# Patient Record
Sex: Female | Born: 1995 | Race: Black or African American | Hispanic: No | Smoking: Never smoker
Health system: Southern US, Community
[De-identification: ages and names within clinical notes are randomized; demographics above are authoritative.]

## PROBLEM LIST (undated history)

## (undated) ENCOUNTER — Inpatient Hospital Stay (HOSPITAL_COMMUNITY): Payer: Self-pay

## (undated) DIAGNOSIS — D649 Anemia, unspecified: Secondary | ICD-10-CM

## (undated) DIAGNOSIS — Z789 Other specified health status: Secondary | ICD-10-CM

## (undated) HISTORY — DX: Anemia, unspecified: D64.9

## (undated) HISTORY — PX: TONSILLECTOMY: SUR1361

---
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
## 2015-02-21 ENCOUNTER — Emergency Department (HOSPITAL_COMMUNITY)
Admission: EM | Admit: 2015-02-21 | Discharge: 2015-02-21 | Disposition: A | Discharge status: Home / Self Care | Payer: No Typology Code available for payment source | Attending: Emergency Medicine | Admitting: Emergency Medicine

## 2015-02-21 ENCOUNTER — Encounter (HOSPITAL_COMMUNITY): Payer: Self-pay | Admitting: *Deleted

## 2015-02-21 DIAGNOSIS — K088 Other specified disorders of teeth and supporting structures: Secondary | ICD-10-CM | POA: Diagnosis not present

## 2015-02-21 DIAGNOSIS — K029 Dental caries, unspecified: Secondary | ICD-10-CM | POA: Insufficient documentation

## 2015-02-21 DIAGNOSIS — Z79899 Other long term (current) drug therapy: Secondary | ICD-10-CM | POA: Insufficient documentation

## 2015-02-21 DIAGNOSIS — H9203 Otalgia, bilateral: Secondary | ICD-10-CM | POA: Diagnosis not present

## 2015-02-21 DIAGNOSIS — K0889 Other specified disorders of teeth and supporting structures: Secondary | ICD-10-CM

## 2015-02-21 MED ORDER — PENICILLIN V POTASSIUM 500 MG PO TABS: 500.0000 mg | ORAL_TABLET | Freq: Four times a day (QID) | ORAL | Status: AC

## 2015-02-21 MED ORDER — KETOROLAC TROMETHAMINE 60 MG/2ML IM SOLN
60.0000 mg | Freq: Once | INTRAMUSCULAR | Status: AC
  Administered 2015-02-21: 60 mg via INTRAMUSCULAR
  Filled 2015-02-21: qty 2

## 2015-02-21 MED ORDER — IBUPROFEN 600 MG PO TABS: 600.0000 mg | ORAL_TABLET | Freq: Four times a day (QID) | ORAL | Status: DC | PRN

## 2015-02-21 NOTE — ED Provider Notes (Signed)
CSN: [id number]     Arrival date & time 02/21/15  0727 History   First MD Initiated Contact with Patient 02/21/15 [phone]     Chief Complaint  Patient presents with  . Dental Pain  . Otalgia     (Consider location/radiation/quality/duration/timing/severity/associated sxs/prior Treatment) HPI  Donna Singh is a 19 y.o. female presenting with left lower dental pain that started days ago it is described as throbbing and worse with lying down. Pain radiates to bilateral ears. Patient hasn't taken anything for her symptoms. She denies alleviating factors. No fevers or chills. No drooling, chest pain, shortness of breath. No history of diabetes. Patient  Has not seen a dentist.  History reviewed. No pertinent past medical history. History reviewed. No pertinent past surgical history. History reviewed. No pertinent family history. History  Substance Use Topics  . Smoking status: Never Smoker   . Smokeless tobacco: Not on file  . Alcohol Use: No   OB History    No data available     Review of Systems  Constitutional: Negative for fever and chills.  HENT: Positive for dental problem. Negative for drooling.   Gastrointestinal: Negative for nausea and vomiting.      Allergies  Review of patient's allergies indicates no known allergies.  Home Medications   Prior to Admission medications   Medication Sig Start Date End Date Taking? Authorizing Provider  HYDROcodone-acetaminophen (NORCO/VICODIN) 5-325 MG per tablet Take 1-2 tablets by mouth every 6 (six) hours as needed. 12/01/14   Teressa LowerVrinda Pickering, NP  ibuprofen (ADVIL,MOTRIN) 600 MG tablet Take 1 tablet (600 mg total) by mouth every 6 (six) hours as needed. 02/21/15   Oswaldo ConroyVictoria Twan Harkin, PA-C  penicillin v potassium (VEETID) 500 MG tablet Take 1 tablet (500 mg total) by mouth 4 (four) times daily. 02/21/15 02/28/15  Oswaldo ConroyVictoria Aimee Heldman, PA-C   BP 121/78 mmHg  Pulse 76  Temp(Src) 99.1 F (37.3 C) (Oral)  Resp 18  SpO2 100%  LMP  01/24/2015 Physical Exam  Constitutional: She appears well-developed and well-nourished. No distress.  HENT:  Head: Normocephalic and atraumatic.  Mouth/Throat: Oropharynx is clear and moist. No oropharyngeal exudate.  No trismus or uvula deviation No lip, tongue, facial swelling. No swelling under tongue or tenderness. Patient handling secretions. No drooling. Patient with poor dentition. Patient with tenderness to left lower tooth #[id number] with significant decay with mild erythema in this gingiva but no drainable abscess.   Eyes: Conjunctivae are normal. Right eye exhibits no discharge. Left eye exhibits no discharge.  Neck: Normal range of motion. Neck supple.  No neck masses or tenderness.  Cardiovascular: Normal rate and regular rhythm.   Pulmonary/Chest: Effort normal and breath sounds normal. No respiratory distress. She has no wheezes.  Abdominal: Soft. She exhibits no distension. There is no tenderness.  Lymphadenopathy:    She has no cervical adenopathy.  Neurological: She is alert. Coordination normal.  Skin: Skin is warm and dry. She is not diaphoretic.  Nursing note and vitals reviewed.   ED Course  Procedures (including critical care time) Labs Review Labs Reviewed - No data to display  Imaging Review No results found.   EKG Interpretation None      Meds given in ED:  Medications  ketorolac (TORADOL) injection 60 mg (60 mg Intramuscular Given 02/21/15 0807)    New Prescriptions   IBUPROFEN (ADVIL,MOTRIN) 600 MG TABLET    Take 1 tablet (600 mg total) by mouth every 6 (six) hours as needed.   PENICILLIN V POTASSIUM (VEETID) 500  MG TABLET    Take 1 tablet (500 mg total) by mouth 4 (four) times daily.      MDM   Final diagnoses:  Pain, dental   Patient with toothache.  No gross abscess.  No history of diabetes. Exam unconcerning for Ludwig's angina or spread of infection.  Will treat with penicillin and NSAIDs. Urged patient to follow-up with dentist.  ED  resources provided. Referral provided.  Discussed return precautions with patient. Patient and father verbalizes understanding and agrees with plan.    Oswaldo Conroy, PA-C 02/21/15 1610  Samuel Jester, DO 02/22/15 [phone]

## 2015-02-21 NOTE — ED Notes (Signed)
Declined W/C at D/C and was escorted to lobby by RN. 

## 2015-02-21 NOTE — ED Notes (Signed)
Pt reports Dental pain started on Monday and now Pt as Bil. Ear pain.

## 2015-02-21 NOTE — Discharge Instructions (Signed)
Return to the emergency room with worsening of symptoms, new symptoms or with symptoms that are concerning, especially fevers, facial or tongue swelling, fevers, unable to open mouth fully. Please take all of your antibiotics until finished!   You may develop abdominal discomfort or diarrhea from the antibiotic.  You may help offset this with probiotics which you can buy or get in yogurt. Do not eat  or take the probiotics until 2 hours after your antibiotic.  Follow up with dentist as soon as possible. Call number above and tell them you were seen in the emergency room. Read below information and follow recommendations.  Dental Pain A tooth ache may be caused by cavities (tooth decay). Cavities expose the nerve of the tooth to air and hot or cold temperatures. It may come from an infection or abscess (also called a boil or furuncle) around your tooth. It is also often caused by dental caries (tooth decay). This causes the pain you are having. DIAGNOSIS  Your caregiver can diagnose this problem by exam. TREATMENT   If caused by an infection, it may be treated with medications which kill germs (antibiotics) and pain medications as prescribed by your caregiver. Take medications as directed.  Only take over-the-counter or prescription medicines for pain, discomfort, or fever as directed by your caregiver.  Whether the tooth ache today is caused by infection or dental disease, you should see your dentist as soon as possible for further care. SEEK MEDICAL CARE IF: The exam and treatment you received today has been provided on an emergency basis only. This is not a substitute for complete medical or dental care. If your problem worsens or new problems (symptoms) appear, and you are unable to meet with your dentist, call or return to this location. SEEK IMMEDIATE MEDICAL CARE IF:   You have a fever.  You develop redness and swelling of your face, jaw, or neck.  You are unable to open your  mouth.  You have severe pain uncontrolled by pain medicine. MAKE SURE YOU:   Understand these instructions.  Will watch your condition.  Will get help right away if you are not doing well or get worse. Document Released: 09/01/2005 Document Revised: 11/24/2011 Document Reviewed: 04/19/2008 [facility] Patient Information 2015 [place], [place]. This information is not intended to replace advice given to you by your health care provider. Make sure you discuss any questions you have with your health care provider.

## 2015-03-24 ENCOUNTER — Emergency Department (HOSPITAL_COMMUNITY)
Admission: EM | Admit: 2015-03-24 | Discharge: 2015-03-24 | Disposition: A | Discharge status: Home / Self Care | Payer: No Typology Code available for payment source | Attending: Emergency Medicine | Admitting: Emergency Medicine

## 2015-03-24 ENCOUNTER — Encounter (HOSPITAL_COMMUNITY): Payer: Self-pay | Admitting: Emergency Medicine

## 2015-03-24 ENCOUNTER — Emergency Department (HOSPITAL_COMMUNITY): Payer: No Typology Code available for payment source

## 2015-03-24 DIAGNOSIS — Y9389 Activity, other specified: Secondary | ICD-10-CM | POA: Diagnosis not present

## 2015-03-24 DIAGNOSIS — S29001A Unspecified injury of muscle and tendon of front wall of thorax, initial encounter: Secondary | ICD-10-CM | POA: Diagnosis present

## 2015-03-24 DIAGNOSIS — Y92410 Unspecified street and highway as the place of occurrence of the external cause: Secondary | ICD-10-CM | POA: Insufficient documentation

## 2015-03-24 DIAGNOSIS — Y998 Other external cause status: Secondary | ICD-10-CM | POA: Diagnosis not present

## 2015-03-24 DIAGNOSIS — R0789 Other chest pain: Secondary | ICD-10-CM

## 2015-03-24 MED ORDER — NAPROXEN 500 MG PO TABS: 500.0000 mg | ORAL_TABLET | Freq: Two times a day (BID) | ORAL | Status: DC

## 2015-03-24 MED ORDER — IBUPROFEN 800 MG PO TABS: 800.0000 mg | ORAL_TABLET | Freq: Once | ORAL | Status: DC

## 2015-03-24 MED ORDER — OXYCODONE-ACETAMINOPHEN 5-325 MG PO TABS
1.0000 | ORAL_TABLET | Freq: Once | ORAL | Status: AC
  Administered 2015-03-24: 1 via ORAL
  Filled 2015-03-24: qty 1
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
## 2015-12-08 ENCOUNTER — Encounter (HOSPITAL_COMMUNITY): Payer: Self-pay | Admitting: Nurse Practitioner

## 2015-12-08 ENCOUNTER — Emergency Department (HOSPITAL_COMMUNITY)
Admission: EM | Admit: 2015-12-08 | Discharge: 2015-12-08 | Disposition: A | Discharge status: Home / Self Care | Payer: No Typology Code available for payment source | Attending: Emergency Medicine | Admitting: Emergency Medicine

## 2015-12-08 DIAGNOSIS — L299 Pruritus, unspecified: Secondary | ICD-10-CM

## 2015-12-08 DIAGNOSIS — L298 Other pruritus: Secondary | ICD-10-CM | POA: Insufficient documentation

## 2015-12-08 DIAGNOSIS — Z791 Long term (current) use of non-steroidal anti-inflammatories (NSAID): Secondary | ICD-10-CM | POA: Insufficient documentation

## 2015-12-08 MED ORDER — LORATADINE 10 MG PO TABS: 10.0000 mg | ORAL_TABLET | Freq: Every day | ORAL | Status: DC

## 2015-12-08 MED ORDER — HYDROCORTISONE 1 % EX CREA: TOPICAL_CREAM | CUTANEOUS | Status: DC

## 2015-12-08 NOTE — ED Notes (Signed)
Declined W/C at D/C and was escorted to lobby by RN. 

## 2015-12-08 NOTE — ED Provider Notes (Signed)
CSN: [id number]     Arrival date & time 12/08/15  1601 History  By signing my name below, I, Emmanuella Mensah, attest that this documentation has been prepared under the direction and in the presence of [employer], PA-C. Electronically Signed: Angelene Giovanni, ED Scribe. 12/08/2015. 4:51 PM.    Chief Complaint  Patient presents with  . Pruritis   The history is provided by the patient. No language interpreter was used.  tch HPI Comments: Donna Singh is a 20 y.o. female with no significant pertinent who presents to the Emergency Department complaining of gradually worsening constant generalized itchiness throughout her body onset 3 weeks ago. She denies noticing any rash, lesions, or sores. She also denies any recent new medications prior to onset, new lotions, new soaps, or any new detergents. Pt states that she was prescribed prednisone and hydroxyzine yesterday by her PCP but she has not seen any improvement in her condition. She denies that she is a current smoker or uses ETOH. She denies any fever, itchy throat, difficulty swallowing, swelling to the tongue/mouth, or SOB.    History reviewed. No pertinent past medical history. History reviewed. No pertinent past surgical history. History reviewed. No pertinent family history. Social History  Substance Use Topics  . Smoking status: Never Smoker   . Smokeless tobacco: None  . Alcohol Use: No   OB History    No data available     Review of Systems  All other systems reviewed and are negative.     Allergies  Review of patient's allergies indicates no known allergies.  Home Medications   Prior to Admission medications   Medication Sig Start Date End Date Taking? Authorizing Provider  HYDROcodone-acetaminophen (NORCO/VICODIN) 5-325 MG per tablet Take 1-2 tablets by mouth every 6 (six) hours as needed. Patient not taking: Reported on 03/24/2015 12/01/14   Teressa Lower, NP  hydrocortisone cream 1 % Apply to affected  area 2 times daily 12/08/15   Shepard Keltz Tripp [place] Mckeough, PA-C  ibuprofen (ADVIL,MOTRIN) 600 MG tablet Take 1 tablet (600 mg total) by mouth every 6 (six) hours as needed. Patient not taking: Reported on 03/24/2015 02/21/15   Oswaldo Conroy, PA-C  loratadine (CLARITIN) 10 MG tablet Take 1 tablet (10 mg total) by mouth daily. 12/08/15   Sulay Brymer Tripp Cybele Maule, PA-C  naproxen (NAPROSYN) 500 MG tablet Take 1 tablet (500 mg total) by mouth 2 (two) times daily. 03/24/15   Robyn M Hess, PA-C   BP 100/66 mmHg  Pulse 76  Temp(Src) 98.6 F (37 C)  Resp 18  Wt 180 lb 3.2 oz (81.738 kg)  SpO2 99% Physical Exam  Constitutional: She is oriented to person, place, and time. She appears well-developed and well-nourished. No distress.  HENT:  Head: Normocephalic and atraumatic.  Eyes: Conjunctivae are normal. Right eye exhibits no discharge. Left eye exhibits no discharge. No scleral icterus.  Cardiovascular: Normal rate.   Pulmonary/Chest: Effort normal.  Neurological: She is alert and oriented to person, place, and time. Coordination normal.  Skin: Skin is warm and dry. No rash noted. She is not diaphoretic. No erythema. No pallor.  Psychiatric: She has a normal mood and affect. Her behavior is normal.  Nursing note and vitals reviewed.   ED Course  Procedures (including critical care time) DIAGNOSTIC STUDIES: Oxygen Saturation is 99% on RA, normal by my interpretation.    COORDINATION OF CARE: 4:48 PM- Pt advised of plan for treatment and pt agrees. Advised pt to be compliant with medication she received yesterday.  She will also receive Claritin and hydrocortisone cream. Recommended to switch soaps, lotions, and detergent to a hypoallergenic brand.   MDM   Final diagnoses:  Pruritus    Otherwise healthy 20 year old female presents to the ED complaining of pruritus going on 3 weeks. She was given hydroxyzine and prednisone by her PCP yesterday with no relief of her symptoms. She denies any history of  liver disease. No alcohol or drug abuse. No rash noted on exam. No airway compromise or facial swelling. Patient appears well in ED. All vital signs are stable. Will add loratadine and Hydrocort cream to regimen. Encouraged patient to follow-up with her PCP in one week if her symptoms are improved with this treatment. She'll require blood work to be drawn at that time. Encouraged patient to switch her home detergents or lotions to hypoallergenic unscented brands. Return precautions outlined to patient discharge instructions.  I personally performed the services described in this documentation, which was scribed in my presence. The recorded information has been reviewed and is accurate.     Lester KinsmanSamantha Tripp [place], PA-C 12/08/15 1655  Benjiman CoreNathan Pickering, MD 12/08/15 [phone]

## 2015-12-08 NOTE — Discharge Instructions (Signed)
Pruritus Pruritus is an itching feeling. There are many different conditions and factors that can make your skin itchy. Dry skin is one of the most common causes of itching. Most cases of itching do not require medical attention. Itchy skin can turn into a rash.  HOME CARE INSTRUCTIONS  Watch your pruritus for any changes. Take these steps to help with your condition:  Skin Care  Moisturize your skin as needed. A moisturizer that contains petroleum jelly is best for keeping moisture in your skin.  Take or apply medicines only as directed by your health care provider. This may include:  Corticosteroid cream.  Anti-itch lotions.  Oral anti-histamines.  Apply cool compresses to the affected areas.  Try taking a bath with:  Epsom salts. Follow the instructions on the packaging. You can get these at your local pharmacy or grocery store.  Baking soda. Pour a small amount into the bath as directed by your health care provider.  Colloidal oatmeal. Follow the instructions on the packaging. You can get this at your local pharmacy or grocery store.  Try applying baking soda paste to your skin. Stir water into baking soda until it reaches a paste-like consistency.   Do not scratch your skin.  Avoid hot showers or baths, which can make itching worse. A cold shower may help with itching as long as you use a moisturizer after.  Avoid scented soaps, detergents, and perfumes. Use gentle soaps, detergents, perfumes, and other cosmetic products. General Instructions  Avoid wearing tight clothes.  Keep a journal to help track what causes your itch. Write down:  What you eat.  What cosmetic products you use.  What you drink.  What you wear. This includes jewelry.  Use a humidifier. This keeps the air moist, which helps to prevent dry skin. SEEK MEDICAL CARE IF:  The itching does not go away after several days.  You sweat at night.  You have weight loss.  You are unusually  thirsty.  You urinate more than normal.  You are more tired than normal.  You have abdominal pain.  Your skin tingles.  You feel weak.  Your skin or the whites of your eyes look yellow (jaundice).  Your skin feels numb.   This information is not intended to replace advice given to you by your health care provider. Make sure you discuss any questions you have with your health care provider.  Continue taking home hydroxyzine and prednisone as prescribed. Take the ranitidine daily. Apply hydrocortisone cream to your skin as needed for itch. Avoid use of scented lotions and detergents. You may need to switch to hypoallergenic. Follow-up with your primary care doctor in 1 week if her symptoms do not improve with this treatment. He may require blood work to be drawn at that time. Return to the emergency department if you experience severe worsening of your symptoms, fever, chills, rash, yellowing of your eyes, abdominal pain, fever.

## 2015-12-08 NOTE — ED Notes (Signed)
She c/o 3 week history of itching all over her entire body. She denies sores, rashes, lesions. She was given prednisone and hydroxyzine by a healthcare provider yesterday which she tried with no relief.

## 2016-12-18 ENCOUNTER — Inpatient Hospital Stay (HOSPITAL_COMMUNITY)
Admission: AD | Admit: 2016-12-18 | Discharge: 2016-12-19 | Disposition: A | Discharge status: Home / Self Care | Payer: Medicaid Other | Source: Ambulatory Visit | Attending: Obstetrics & Gynecology | Admitting: Obstetrics & Gynecology

## 2016-12-18 DIAGNOSIS — O219 Vomiting of pregnancy, unspecified: Secondary | ICD-10-CM | POA: Insufficient documentation

## 2016-12-18 DIAGNOSIS — Z3A08 8 weeks gestation of pregnancy: Secondary | ICD-10-CM | POA: Insufficient documentation

## 2016-12-18 DIAGNOSIS — R109 Unspecified abdominal pain: Secondary | ICD-10-CM

## 2016-12-18 DIAGNOSIS — R1013 Epigastric pain: Secondary | ICD-10-CM | POA: Insufficient documentation

## 2016-12-18 DIAGNOSIS — O26891 Other specified pregnancy related conditions, first trimester: Secondary | ICD-10-CM | POA: Insufficient documentation

## 2016-12-18 DIAGNOSIS — Z79899 Other long term (current) drug therapy: Secondary | ICD-10-CM | POA: Insufficient documentation

## 2016-12-19 ENCOUNTER — Encounter (HOSPITAL_COMMUNITY): Payer: Self-pay

## 2016-12-19 ENCOUNTER — Inpatient Hospital Stay (HOSPITAL_COMMUNITY): Payer: Medicaid Other

## 2016-12-19 DIAGNOSIS — Z79899 Other long term (current) drug therapy: Secondary | ICD-10-CM | POA: Diagnosis not present

## 2016-12-19 DIAGNOSIS — R1013 Epigastric pain: Secondary | ICD-10-CM | POA: Diagnosis present

## 2016-12-19 DIAGNOSIS — O219 Vomiting of pregnancy, unspecified: Secondary | ICD-10-CM

## 2016-12-19 DIAGNOSIS — O26891 Other specified pregnancy related conditions, first trimester: Secondary | ICD-10-CM | POA: Diagnosis not present

## 2016-12-19 DIAGNOSIS — Z3A08 8 weeks gestation of pregnancy: Secondary | ICD-10-CM | POA: Diagnosis not present

## 2016-12-19 LAB — CBC
HEMATOCRIT: 31.2 % — AB (ref 36.0–46.0)
HEMOGLOBIN: 10.6 g/dL — AB (ref 12.0–15.0)
MCH: 24.7 pg — ABNORMAL LOW (ref 26.0–34.0)
MCHC: 34 g/dL (ref 30.0–36.0)
MCV: 72.6 fL — ABNORMAL LOW (ref 78.0–100.0)
Platelets: 370 10*3/uL (ref 150–400)
RBC: 4.3 MIL/uL (ref 3.87–5.11)
RDW: 14.2 % (ref 11.5–15.5)
WBC: 7.6 10*3/uL (ref 4.0–10.5)

## 2016-12-19 LAB — RPR: RPR Ser Ql: NONREACTIVE

## 2016-12-19 LAB — URINALYSIS, ROUTINE W REFLEX MICROSCOPIC
Bilirubin Urine: NEGATIVE
GLUCOSE, UA: NEGATIVE mg/dL
HGB URINE DIPSTICK: NEGATIVE
Ketones, ur: NEGATIVE mg/dL
LEUKOCYTES UA: NEGATIVE
Nitrite: NEGATIVE
PROTEIN: NEGATIVE mg/dL
SPECIFIC GRAVITY, URINE: 1.014 (ref 1.005–1.030)
pH: 5 (ref 5.0–8.0)

## 2016-12-19 LAB — WET PREP, GENITAL
SPERM: NONE SEEN
Trich, Wet Prep: NONE SEEN
Yeast Wet Prep HPF POC: NONE SEEN

## 2016-12-19 LAB — GC/CHLAMYDIA PROBE AMP (~~LOC~~) NOT AT ARMC
Chlamydia: NEGATIVE
Neisseria Gonorrhea: NEGATIVE

## 2016-12-19 LAB — POCT PREGNANCY, URINE: Preg Test, Ur: POSITIVE — AB

## 2016-12-19 LAB — HIV ANTIBODY (ROUTINE TESTING W REFLEX): HIV SCREEN 4TH GENERATION: NONREACTIVE

## 2016-12-19 LAB — HCG, QUANTITATIVE, PREGNANCY: HCG, BETA CHAIN, QUANT, S: 168360 m[IU]/mL — AB (ref <5)

## 2016-12-19 LAB — ABO/RH: ABO/RH(D): O POS

## 2016-12-19 MED ORDER — PROMETHAZINE HCL 25 MG PO TABS: 12.5000 mg | ORAL_TABLET | Freq: Four times a day (QID) | ORAL | 2 refills | Status: DC | PRN

## 2016-12-19 MED ORDER — PROMETHAZINE HCL 25 MG/ML IJ SOLN
25.0000 mg | Freq: Once | INTRAMUSCULAR | Status: AC
  Administered 2016-12-19: 25 mg via INTRAMUSCULAR
  Filled 2016-12-19: qty 1

## 2016-12-19 NOTE — MAU Note (Signed)
Pt c/o mid/upper abdominal pain that started 1 week ago, but has gradually gotten worse tonight. Pt c/o nausea and vomiting, stating that she has vomited every time she eats. Pt states she +upt at home, 3 weeks ago. LMP:10/24/2016. Pt denies vaginal bleeding or discharge.

## 2016-12-19 NOTE — Discharge Instructions (Signed)
Prenatal Care Providers °[facility]    [facility] ° Phone- [phone]     Phone: [phone] °         °[facility]                      [facility] ° @[place]     Phone: [phone] ° Phone: [phone] °        [facility] °[facility]     Phone: [phone] ° Phone: [phone]   °        [facility] °[facility] [place]                hone: [phone] ° Phone: [phone] °        [facility] °Dr. Bernard Marshall      Phone: [phone] ° Phone: [phone] °        [facility] °[facility]                Phone: [phone] ° [facility]’s [facility]  ° Phone:[phone]    [facility] °         Phone: [phone] °[facility] °  Phone: [phone] °Safe Medications in Pregnancy  ° °Acne: °Benzoyl Peroxide °Salicylic Acid ° °Backache/Headache: °Tylenol: 2 regular strength every 4 hours OR °             2 Extra strength every 6 hours ° °Colds/Coughs/Allergies: °Benadryl (alcohol free) 25 mg every 6 hours as needed °Breath right strips °Claritin °Cepacol throat lozenges °Chloraseptic throat spray °Cold-Eeze- up to three times per day °Cough drops, alcohol free °Flonase (by prescription only) °Guaifenesin °Mucinex °Robitussin DM (plain only, alcohol free) °Saline nasal spray/drops °Sudafed (pseudoephedrine) & Actifed ** use only after [redacted] weeks gestation and if you do not have high blood pressure °Tylenol °Vicks Vaporub °Zinc lozenges °Zyrtec  ° °Constipation: °Colace °Ducolax suppositories °Fleet enema °Glycerin suppositories °Metamucil °Milk of magnesia °Miralax °Senokot °Smooth move tea ° °Diarrhea: °Kaopectate °Imodium A-D ° °*NO pepto Bismol ° °Hemorrhoids: °Anusol °Anusol HC °Preparation H °Tucks ° °Indigestion: °Tums °Maalox °Mylanta °Zantac  °Pepcid ° °Insomnia: °Benadryl (alcohol free) 25mg every 6 hours as needed °Tylenol  PM °Unisom, no Gelcaps ° °Leg Cramps: °Tums °MagGel ° °Nausea/Vomiting:  °Bonine °Dramamine °Emetrol °Ginger extract °Sea bands °Meclizine  °Nausea medication to take during pregnancy:  °Unisom (doxylamine succinate 25 mg tablets) Take one tablet daily at bedtime. If symptoms are not adequately controlled, the dose can be increased to a maximum recommended dose of two tablets daily (1/2 tablet in the morning, 1/2 tablet mid-afternoon and one at bedtime). °Vitamin B6 100mg tablets. Take one tablet twice a day (up to 200 mg per day). ° °Skin Rashes: °Aveeno products °Benadryl cream or 25mg every 6 hours as needed °Calamine Lotion °1% cortisone cream ° °Yeast infection: °Gyne-lotrimin 7 °Monistat 7 ° ° °**If taking multiple medications, please check labels to avoid duplicating the same active ingredients °**take medication as directed on the label °** Do not exceed 4000 mg of tylenol in 24 hours °**Do not take medications that contain aspirin or ibuprofen ° ° ° ° °First Trimester of Pregnancy °The first trimester of pregnancy is from week 1 until the end of week 13 (months 1 through 3). A week after a sperm fertilizes an egg, the egg will implant on the wall of the uterus. This   embryo will begin to develop into a baby. Genes from you and your partner will form the baby. The female genes will determine whether the baby will be a boy or a girl. At 6-8 weeks, the eyes and face will be formed, and the heartbeat can be seen on ultrasound. At the end of 12 weeks, all the baby's organs will be formed. °Now that you are pregnant, you will want to do everything you can to have a healthy baby. Two of the most important things are to get good prenatal care and to follow your health care provider's instructions. Prenatal care is all the medical care you receive before the baby's birth. This care will help prevent, find, and treat any problems during the pregnancy and childbirth. °Body changes during your first trimester °Your body  goes through many changes during pregnancy. The changes vary from woman to woman. °· You may gain or lose a couple of pounds at first. °· You may feel sick to your stomach (nauseous) and you may throw up (vomit). If the vomiting is uncontrollable, call your health care provider. °· You may tire easily. °· You may develop headaches that can be relieved by medicines. All medicines should be approved by your health care provider. °· You may urinate more often. Painful urination may mean you have a bladder infection. °· You may develop heartburn as a result of your pregnancy. °· You may develop constipation because certain hormones are causing the muscles that push stool through your intestines to slow down. °· You may develop hemorrhoids or swollen veins (varicose veins). °· Your breasts may begin to grow larger and become tender. Your nipples may stick out more, and the tissue that surrounds them (areola) may become darker. °· Your gums may bleed and may be sensitive to brushing and flossing. °· Dark spots or blotches (chloasma, mask of pregnancy) may develop on your face. This will likely fade after the baby is born. °· Your menstrual periods will stop. °· You may have a loss of appetite. °· You may develop cravings for certain kinds of food. °· You may have changes in your emotions from day to day, such as being excited to be pregnant or being concerned that something may go wrong with the pregnancy and baby. °· You may have more vivid and strange dreams. °· You may have changes in your hair. These can include thickening of your hair, rapid growth, and changes in texture. Some women also have hair loss during or after pregnancy, or hair that feels dry or thin. Your hair will most likely return to normal after your baby is born. ° °What to expect at prenatal visits °During a routine prenatal visit: °· You will be weighed to make sure you and the baby are growing normally. °· Your blood pressure will be taken. °· Your  abdomen will be measured to track your baby's growth. °· The fetal heartbeat will be listened to between weeks 10 and 14 of your pregnancy. °· Test results from any previous visits will be discussed. ° °Your health care provider may ask you: °· How you are feeling. °· If you are feeling the baby move. °· If you have had any abnormal symptoms, such as leaking fluid, bleeding, severe headaches, or abdominal cramping. °· If you are using any tobacco products, including cigarettes, chewing tobacco, and electronic cigarettes. °· If you have any questions. ° °Other tests that may be performed during your first trimester include: °· Blood tests to find   your blood type and to check for the presence of any previous infections. The tests will also be used to check for low iron levels (anemia) and protein on red blood cells (Rh antibodies). Depending on your risk factors, or if you previously had diabetes during pregnancy, you may have tests to check for high blood sugar that affects pregnant women (gestational diabetes). °· Urine tests to check for infections, diabetes, or protein in the urine. °· An ultrasound to confirm the proper growth and development of the baby. °· Fetal screens for spinal cord problems (spina bifida) and Down syndrome. °· HIV (human immunodeficiency virus) testing. Routine prenatal testing includes screening for HIV, unless you choose not to have this test. °· You may need other tests to make sure you and the baby are doing well. ° °Follow these instructions at home: °Medicines °· Follow your health care provider's instructions regarding medicine use. Specific medicines may be either safe or unsafe to take during pregnancy. °· Take a prenatal vitamin that contains at least 600 micrograms (mcg) of folic acid. °· If you develop constipation, try taking a stool softener if your health care provider approves. °Eating and drinking °· Eat a balanced diet that includes fresh fruits and vegetables, whole  grains, good sources of protein such as meat, eggs, or tofu, and low-fat dairy. Your health care provider will help you determine the amount of weight gain that is right for you. °· Avoid raw meat and uncooked cheese. These carry germs that can cause birth defects in the baby. °· Eating four or five small meals rather than three large meals a day may help relieve nausea and vomiting. If you start to feel nauseous, eating a few soda crackers can be helpful. Drinking liquids between meals, instead of during meals, also seems to help ease nausea and vomiting. °· Limit foods that are high in fat and processed sugars, such as fried and sweet foods. °· To prevent constipation: °? Eat foods that are high in fiber, such as fresh fruits and vegetables, whole grains, and beans. °? Drink enough fluid to keep your urine clear or pale yellow. °Activity °· Exercise only as directed by your health care provider. Most women can continue their usual exercise routine during pregnancy. Try to exercise for 30 minutes at least 5 days a week. Exercising will help you: °? Control your weight. °? Stay in shape. °? Be prepared for labor and delivery. °· Experiencing pain or cramping in the lower abdomen or lower back is a good sign that you should stop exercising. Check with your health care provider before continuing with normal exercises. °· Try to avoid standing for long periods of time. Move your legs often if you must stand in one place for a long time. °· Avoid heavy lifting. °· Wear low-heeled shoes and practice good posture. °· You may continue to have sex unless your health care provider tells you not to. °Relieving pain and discomfort °· Wear a good support bra to relieve breast tenderness. °· Take warm sitz baths to soothe any pain or discomfort caused by hemorrhoids. Use hemorrhoid cream if your health care provider approves. °· Rest with your legs elevated if you have leg cramps or low back pain. °· If you develop varicose  veins in your legs, wear support hose. Elevate your feet for 15 minutes, 3-4 times a day. Limit salt in your diet. °Prenatal care °· Schedule your prenatal visits by the twelfth week of pregnancy. They are usually scheduled monthly   at first, then more often in the last 2 months before delivery. °· Write down your questions. Take them to your prenatal visits. °· Keep all your prenatal visits as told by your health care provider. This is important. °Safety °· Wear your seat belt at all times when driving. °· Make a list of emergency phone numbers, including numbers for family, friends, the hospital, and police and fire departments. °General instructions °· Ask your health care provider for a referral to a local prenatal education class. Begin classes no later than the beginning of month 6 of your pregnancy. °· Ask for help if you have counseling or nutritional needs during pregnancy. Your health care provider can offer advice or refer you to specialists for help with various needs. °· Do not use hot tubs, steam rooms, or saunas. °· Do not douche or use tampons or scented sanitary pads. °· Do not cross your legs for long periods of time. °· Avoid cat litter boxes and soil used by cats. These carry germs that can cause birth defects in the baby and possibly loss of the fetus by miscarriage or stillbirth. °· Avoid all smoking, herbs, alcohol, and medicines not prescribed by your health care provider. Chemicals in these products affect the formation and growth of the baby. °· Do not use any products that contain nicotine or tobacco, such as cigarettes and e-cigarettes. If you need help quitting, ask your health care provider. You may receive counseling support and other resources to help you quit. °· Schedule a dentist appointment. At home, brush your teeth with a soft toothbrush and be gentle when you floss. °Contact a health care provider if: °· You have dizziness. °· You have mild pelvic cramps, pelvic pressure, or  nagging pain in the abdominal area. °· You have persistent nausea, vomiting, or diarrhea. °· You have a bad smelling vaginal discharge. °· You have pain when you urinate. °· You notice increased swelling in your face, hands, legs, or ankles. °· You are exposed to fifth disease or chickenpox. °· You are exposed to German measles (rubella) and have never had it. °Get help right away if: °· You have a fever. °· You are leaking fluid from your vagina. °· You have spotting or bleeding from your vagina. °· You have severe abdominal cramping or pain. °· You have rapid weight gain or loss. °· You vomit blood or material that looks like coffee grounds. °· You develop a severe headache. °· You have shortness of breath. °· You have any kind of trauma, such as from a fall or a car accident. °Summary °· The first trimester of pregnancy is from week 1 until the end of week 13 (months 1 through 3). °· Your body goes through many changes during pregnancy. The changes vary from woman to woman. °· You will have routine prenatal visits. During those visits, your health care provider will examine you, discuss any test results you may have, and talk with you about how you are feeling. °This information is not intended to replace advice given to you by your health care provider. Make sure you discuss any questions you have with your health care provider. °Document Released: 08/26/2001 Document Revised: 08/13/2016 Document Reviewed: 08/13/2016 °Elsevier Interactive Patient Education © 2017 [employer] ° °

## 2016-12-19 NOTE — MAU Provider Note (Signed)
History     CSN: [id number]  Arrival date and time: 12/18/16 2356   First Provider Initiated Contact with Patient 12/19/16 0026      Chief Complaint  Patient presents with  . Abdominal Pain   Donna Singh is 21 y.o. G1P0 at [redacted]w[redacted]d who presents today with nausea/vomiting and abdominal pain. She denies any vaginal bleeding. She states that she has been vomiting about 3-4 x per day.    Abdominal Pain  This is a new problem. The current episode started 1 to 4 weeks ago. The onset quality is gradual. The problem occurs intermittently. The problem has been gradually worsening. The pain is located in the periumbilical region. The pain is at a severity of 8/10. The quality of the pain is sharp. The abdominal pain does not radiate. Associated symptoms include nausea and vomiting. Pertinent negatives include no dysuria, fever or frequency. Nothing aggravates the pain. The pain is relieved by nothing. She has tried nothing for the symptoms.    History reviewed. No pertinent past medical history.  History reviewed. No pertinent surgical history.  History reviewed. No pertinent family history.  Social History  Substance Use Topics  . Smoking status: Never Smoker  . Smokeless tobacco: Not on file  . Alcohol use No    Allergies: No Known Allergies  Prescriptions Prior to Admission  Medication Sig Dispense Refill Last Dose  . HYDROcodone-acetaminophen (NORCO/VICODIN) 5-325 MG per tablet Take 1-2 tablets by mouth every 6 (six) hours as needed. (Patient not taking: Reported on 03/24/2015) 10 tablet 0   . hydrocortisone cream 1 % Apply to affected area 2 times daily 15 g 0   . ibuprofen (ADVIL,MOTRIN) 600 MG tablet Take 1 tablet (600 mg total) by mouth every 6 (six) hours as needed. (Patient not taking: Reported on 03/24/2015) 30 tablet 0   . loratadine (CLARITIN) 10 MG tablet Take 1 tablet (10 mg total) by mouth daily. 30 tablet 0   . naproxen (NAPROSYN) 500 MG tablet Take 1 tablet (500 mg total) by  mouth 2 (two) times daily. 15 tablet 0     Review of Systems  Constitutional: Negative for chills and fever.  Gastrointestinal: Positive for abdominal pain, nausea and vomiting.  Genitourinary: Negative for dysuria, frequency, urgency, vaginal bleeding and vaginal discharge.   Physical Exam   Blood pressure 107/61, pulse 77, temperature 98.9 F (37.2 C), temperature source Oral, resp. rate 16, height  (1.626 m), weight 162 lb (73.5 kg), last menstrual period 10/24/2016, SpO2 100 %.  Physical Exam  Nursing note and vitals reviewed. Constitutional: She is oriented to person, place, and time. She appears well-developed and well-nourished. No distress.  HENT:  Head: Normocephalic.  Cardiovascular: Normal rate.   Respiratory: Effort normal.  GI: Soft. There is no tenderness. There is no rebound.  Neurological: She is alert and oriented to person, place, and time.  Skin: Skin is warm and dry.  Psychiatric: She has a normal mood and affect.     Results for orders placed or performed during the hospital encounter of 12/18/16 (from the past 24 hour(s))  Urinalysis, Routine w reflex microscopic     Status: None   Collection Time: 12/19/16 12:02 AM  Result Value Ref Range   Color, Urine YELLOW YELLOW   APPearance CLEAR CLEAR   Specific Gravity, Urine 1.014 1.005 - 1.030   pH 5.0 5.0 - 8.0   Glucose, UA NEGATIVE NEGATIVE mg/dL   Hgb urine dipstick NEGATIVE NEGATIVE   Bilirubin Urine NEGATIVE  NEGATIVE   Ketones, ur NEGATIVE NEGATIVE mg/dL   Protein, ur NEGATIVE NEGATIVE mg/dL   Nitrite NEGATIVE NEGATIVE   Leukocytes, UA NEGATIVE NEGATIVE  Pregnancy, urine POC     Status: Abnormal   Collection Time: 12/19/16 12:10 AM  Result Value Ref Range   Preg Test, Ur POSITIVE (A) NEGATIVE  CBC     Status: Abnormal   Collection Time: 12/19/16 12:30 AM  Result Value Ref Range   WBC 7.6 4.0 - 10.5 K/uL   RBC 4.30 3.87 - 5.11 MIL/uL   Hemoglobin 10.6 (L) 12.0 - 15.0 g/dL   HCT 21.3 (L)  08.6 - 46.0 %   MCV 72.6 (L) 78.0 - 100.0 fL   MCH 24.7 (L) 26.0 - 34.0 pg   MCHC 34.0 30.0 - 36.0 g/dL   RDW 57.8 46.9 - 62.9 %   Platelets 370 150 - 400 K/uL  ABO/Rh     Status: None (Preliminary result)   Collection Time: 12/19/16 12:34 AM  Result Value Ref Range   ABO/RH(D) O POS   Wet prep, genital     Status: Abnormal   Collection Time: 12/19/16  1:13 AM  Result Value Ref Range   Yeast Wet Prep HPF POC NONE SEEN NONE SEEN   Trich, Wet Prep NONE SEEN NONE SEEN   Clue Cells Wet Prep HPF POC PRESENT (A) NONE SEEN   WBC, Wet Prep HPF POC FEW (A) NONE SEEN   Sperm NONE SEEN    US Ob Comp Less 14 Wks  Result Date: 12/19/2016 CLINICAL DATA:  Pelvic pain for 1 week EXAM: OBSTETRIC <14 WK ULTRASOUND TECHNIQUE: Transabdominal ultrasound was performed for evaluation of the gestation as well as the maternal uterus and adnexal regions. COMPARISON:  None. FINDINGS: Intrauterine gestational sac: Single Yolk sac:  Visible Embryo:  Visible Cardiac Activity: Visible Heart Rate: 175 bpm MSD:   mm    w     d CRL:   19.6  mm   8 w 3 d                  [place] EDC: 07/28/2017 Subchorionic hemorrhage:  None visualized. Maternal uterus/adnexae: Normal ovaries. 3.5 cm right ovarian cyst may be corpus luteum. No abnormal pelvic fluid collections. IMPRESSION: Single living intrauterine gestation measuring 8 weeks 3 days by crown-rump length. Electronically Signed   By: Ellery Plunk M.D.   On: 12/19/2016 01:22    MAU Course  Procedures  MDM   Assessment and Plan   1. Nausea and vomiting during pregnancy   2. Abdominal pain in pregnancy, first trimester   3. [redacted] weeks gestation of pregnancy    DC home Comfort measures reviewed  1st Trimester precautions  RX: phenergan PRN #30 with 2RF  Return to MAU as needed FU with OB as planned  Follow-up Information    [facility] Follow up.   Contact information: [address] [place] [address] [phone]            Tawnya Crook 12/19/2016, 12:27 AM

## 2017-02-12 ENCOUNTER — Inpatient Hospital Stay (HOSPITAL_COMMUNITY)
Admission: AD | Admit: 2017-02-12 | Discharge: 2017-02-12 | Disposition: A | Discharge status: Home / Self Care | Payer: Medicaid Other | Source: Ambulatory Visit | Attending: Obstetrics and Gynecology | Admitting: Obstetrics and Gynecology

## 2017-02-12 ENCOUNTER — Inpatient Hospital Stay (HOSPITAL_COMMUNITY): Payer: Medicaid Other

## 2017-02-12 ENCOUNTER — Encounter (HOSPITAL_COMMUNITY): Payer: Self-pay

## 2017-02-12 DIAGNOSIS — O26892 Other specified pregnancy related conditions, second trimester: Secondary | ICD-10-CM | POA: Insufficient documentation

## 2017-02-12 DIAGNOSIS — N949 Unspecified condition associated with female genital organs and menstrual cycle: Secondary | ICD-10-CM

## 2017-02-12 DIAGNOSIS — N760 Acute vaginitis: Secondary | ICD-10-CM | POA: Diagnosis not present

## 2017-02-12 DIAGNOSIS — O9989 Other specified diseases and conditions complicating pregnancy, childbirth and the puerperium: Secondary | ICD-10-CM

## 2017-02-12 DIAGNOSIS — Z3A15 15 weeks gestation of pregnancy: Secondary | ICD-10-CM | POA: Diagnosis not present

## 2017-02-12 DIAGNOSIS — O26891 Other specified pregnancy related conditions, first trimester: Secondary | ICD-10-CM

## 2017-02-12 DIAGNOSIS — R109 Unspecified abdominal pain: Secondary | ICD-10-CM | POA: Insufficient documentation

## 2017-02-12 DIAGNOSIS — B9689 Other specified bacterial agents as the cause of diseases classified elsewhere: Secondary | ICD-10-CM | POA: Diagnosis not present

## 2017-02-12 DIAGNOSIS — O0932 Supervision of pregnancy with insufficient antenatal care, second trimester: Secondary | ICD-10-CM

## 2017-02-12 HISTORY — DX: Other specified health status: Z78.9

## 2017-02-12 LAB — URINALYSIS, ROUTINE W REFLEX MICROSCOPIC
Bilirubin Urine: NEGATIVE
GLUCOSE, UA: NEGATIVE mg/dL
Hgb urine dipstick: NEGATIVE
Ketones, ur: NEGATIVE mg/dL
LEUKOCYTES UA: NEGATIVE
NITRITE: NEGATIVE
PROTEIN: NEGATIVE mg/dL
Specific Gravity, Urine: 1.011 (ref 1.005–1.030)
pH: 6 (ref 5.0–8.0)

## 2017-02-12 LAB — WET PREP, GENITAL
Sperm: NONE SEEN
TRICH WET PREP: NONE SEEN
Yeast Wet Prep HPF POC: NONE SEEN

## 2017-02-12 MED ORDER — METRONIDAZOLE 500 MG PO TABS: 500.0000 mg | ORAL_TABLET | Freq: Two times a day (BID) | ORAL | 0 refills | Status: AC

## 2017-02-12 NOTE — MAU Provider Note (Signed)
History     CSN: [id number]  Arrival date and time: 02/12/17 1516   First Provider Initiated Contact with Patient 02/12/17 1617      Chief Complaint  Patient presents with  . Vaginal Pain  . Abdominal Cramping   Donna PoissonMonica Singh is a 21 y.o. G1P0 at 10108w6d presenting with 2 day history of lower abdominal and vaginal pain. She describes the pain as sharp and constant but waxes and wanes. Similar to bad menstrual cramping. Her tight clothing causes lower abdominal pain.  NPC, but [facility]  Had US at 3173w3d.     Vaginal Pain  The patient's pertinent negatives include no genital itching, genital lesions, genital rash, vaginal bleeding or vaginal discharge. This is a new problem. The current episode started yesterday. The problem occurs constantly. The problem has been waxing and waning. The pain is moderate. The problem affects both sides. Associated symptoms include abdominal pain. Pertinent negatives include no back pain, constipation, diarrhea, dysuria, fever, frequency, nausea, rash, urgency or vomiting. The symptoms are aggravated by activity. She has tried nothing for the symptoms. No, her partner does not have an STD. There is no history of an abdominal surgery.  Abdominal Cramping  Pertinent negatives include no constipation, diarrhea, dysuria, fever, frequency, nausea or vomiting. There is no history of abdominal surgery.    GYN Hx negative for STIs.  History reviewed. No pertinent past medical history.  History reviewed. No pertinent surgical history.  History reviewed. No pertinent family history.  Social History  Substance Use Topics  . Smoking status: Never Smoker  . Smokeless tobacco: Never Used  . Alcohol use No    Allergies: No Known Allergies  Prescriptions Prior to Admission  Medication Sig Dispense Refill Last Dose  . hydrocortisone cream 1 % Apply to affected area 2 times daily 15 g 0   . loratadine (CLARITIN) 10 MG tablet Take 1 tablet  (10 mg total) by mouth daily. 30 tablet 0   . promethazine (PHENERGAN) 25 MG tablet Take 0.5-1 tablets (12.5-25 mg total) by mouth every 6 (six) hours as needed. 30 tablet 2     Review of Systems  Constitutional: Negative for fever.  HENT: Negative for congestion.   Respiratory: Negative for cough.   Cardiovascular: Negative for chest pain.  Gastrointestinal: Positive for abdominal pain. Negative for constipation, diarrhea, nausea and vomiting.  Genitourinary: Positive for vaginal pain. Negative for dysuria, frequency, genital sores, urgency, vaginal bleeding and vaginal discharge.  Musculoskeletal: Negative for back pain.  Skin: Negative for rash.   Physical Exam   Blood pressure 107/60, pulse 85, temperature 99 F (37.2 C), temperature source Oral, resp. rate 18, height 5\' 4"  (1.626 m), weight 80.7 kg (178 lb), last menstrual period 10/24/2016.  Physical Exam  Nursing note and vitals reviewed. Constitutional: She is oriented to person, place, and time. She appears well-developed and well-nourished. No distress.  HENT:  Head: Normocephalic.  Eyes: No scleral icterus.  Neck: Neck supple. No thyromegaly present.  Cardiovascular: Normal rate.   Respiratory: Effort normal.  GI: Soft. There is no tenderness. There is no rebound and no guarding.  DT FHR 156  Genitourinary: Vaginal discharge found.  Genitourinary Comments: Speculum exam: NEFG Vagina with homogenous white discharge cervix nulliparous closed, no lesions, mobile VE: closed about 2 cm long    Musculoskeletal: Normal range of motion.  Neurological: She is alert and oriented to person, place, and time.  Skin: Skin is warm and dry.  Psychiatric: She  has a normal mood and affect. Her behavior is normal. Thought content normal.    MAU Course  Procedures Results for orders placed or performed during the hospital encounter of 02/12/17 (from the past 24 hour(s))  Urinalysis, Routine w reflex microscopic     Status: None    Collection Time: 02/12/17  3:47 PM  Result Value Ref Range   Color, Urine YELLOW YELLOW   APPearance CLEAR CLEAR   Specific Gravity, Urine 1.011 1.005 - 1.030   pH 6.0 5.0 - 8.0   Glucose, UA NEGATIVE NEGATIVE mg/dL   Hgb urine dipstick NEGATIVE NEGATIVE   Bilirubin Urine NEGATIVE NEGATIVE   Ketones, ur NEGATIVE NEGATIVE mg/dL   Protein, ur NEGATIVE NEGATIVE mg/dL   Nitrite NEGATIVE NEGATIVE   Leukocytes, UA NEGATIVE NEGATIVE  Wet prep, genital     Status: Abnormal   Collection Time: 02/12/17  4:32 PM  Result Value Ref Range   Yeast Wet Prep HPF POC NONE SEEN NONE SEEN   Trich, Wet Prep NONE SEEN NONE SEEN   Clue Cells Wet Prep HPF POC PRESENT (A) NONE SEEN   WBC, Wet Prep HPF POC MANY (A) NONE SEEN   Sperm NONE SEEN     GC/CT sent  Assessment and Plan  21 yo G1 at [redacted]w[redacted]d 1. BV (bacterial vaginosis)   2. Abdominal pain during pregnancy, first trimester   3. Round ligament pain   4. Insufficient prenatal care in second trimester    Allergies as of 02/12/2017   No Known Allergies     Medication List    STOP taking these medications   promethazine 25 MG tablet Commonly known as:  PHENERGAN     TAKE these medications   metroNIDAZOLE 500 MG tablet Commonly known as:  FLAGYL Take 1 tablet (500 mg total) by mouth 2 (two) times daily.   prenatal multivitamin Tabs tablet Take 1 tablet by mouth daily at 12 noon.      Follow-up Information    Department, [facility]. Schedule an appointment as soon as possible for a visit in 1 week(s).   Contact information: [address] [place] [address] [phone]          Cyndie Woodbeck CNM 02/12/2017, 4:18 PM

## 2017-02-12 NOTE — MAU Note (Signed)
Pt reports she I shaving vaginal pain and abd cramping on and off for the past 2 weeks. Not started prenatal care yet. Denies any vaginal bleeding reports some thick whote vaginal discharge.

## 2017-02-12 NOTE — Discharge Instructions (Signed)
Abdominal Pain During Pregnancy Abdominal pain is common in pregnancy. Most of the time, it does not cause harm. There are many causes of abdominal pain. Some causes are more serious than others and sometimes the cause is not known. Abdominal pain can be a sign that something is very wrong with the pregnancy or the pain may have nothing to do with the pregnancy. Always tell your health care provider if you have any abdominal pain. Follow these instructions at home:  Do not have sex or put anything in your vagina until your symptoms go away completely.  Watch your abdominal pain for any changes.  Get plenty of rest until your pain improves.  Drink enough fluid to keep your urine clear or pale yellow.  Take over-the-counter or prescription medicines only as told by your health care provider.  Keep all follow-up visits as told by your health care provider. This is important. Contact a health care provider if:  You have a fever.  Your pain gets worse or you have cramping.  Your pain continues after resting. Get help right away if:  You are bleeding, leaking fluid, or passing tissue from the vagina.  You have vomiting or diarrhea that does not go away.  You have painful or bloody urination.  You notice a decrease in your baby's movements.  You feel very weak or faint.  You have shortness of breath.  You develop a severe headache with abdominal pain.  You have abnormal vaginal discharge with abdominal pain. This information is not intended to replace advice given to you by your health care provider. Make sure you discuss any questions you have with your health care provider. Document Released: 09/01/2005 Document Revised: 06/12/2016 Document Reviewed: 03/31/2013 Elsevier Interactive Patient Education  2018 [employer]  Bacterial Vaginosis Bacterial vaginosis is a vaginal infection that occurs when the normal balance of bacteria in the vagina is disrupted. It results from an  overgrowth of certain bacteria. This is the most common vaginal infection among women ages 7615-44. Because bacterial vaginosis increases your risk for STIs (sexually transmitted infections), getting treated can help reduce your risk for chlamydia, gonorrhea, herpes, and HIV (human immunodeficiency virus). Treatment is also important for preventing complications in pregnant women, because this condition can cause an early (premature) delivery. What are the causes? This condition is caused by an increase in harmful bacteria that are normally present in small amounts in the vagina. However, the reason that the condition develops is not fully understood. What increases the risk? The following factors may make you more likely to develop this condition:  Having a new sexual partner or multiple sexual partners.  Having unprotected sex.  Douching.  Having an intrauterine device (IUD).  Smoking.  Drug and alcohol abuse.  Taking certain antibiotic medicines.  Being pregnant.  You cannot get bacterial vaginosis from toilet seats, bedding, swimming pools, or contact with objects around you. What are the signs or symptoms? Symptoms of this condition include:  Grey or white vaginal discharge. The discharge can also be watery or foamy.  A fish-like odor with discharge, especially after sexual intercourse or during menstruation.  Itching in and around the vagina.  Burning or pain with urination.  Some women with bacterial vaginosis have no signs or symptoms. How is this diagnosed? This condition is diagnosed based on:  Your medical history.  A physical exam of the vagina.  Testing a sample of vaginal fluid under a microscope to look for a large amount of bad bacteria  or abnormal cells. Your health care provider may use a cotton swab or a small wooden spatula to collect the sample.  How is this treated? This condition is treated with antibiotics. These may be given as a pill, a vaginal  cream, or a medicine that is put into the vagina (suppository). If the condition comes back after treatment, a second round of antibiotics may be needed. Follow these instructions at home: Medicines  Take over-the-counter and prescription medicines only as told by your health care provider.  Take or use your antibiotic as told by your health care provider. Do not stop taking or using the antibiotic even if you start to feel better. General instructions  If you have a female sexual partner, tell her that you have a vaginal infection. She should see her health care provider and be treated if she has symptoms. If you have a female sexual partner, he does not need treatment.  During treatment: ? Avoid sexual activity until you finish treatment. ? Do not douche. ? Avoid alcohol as directed by your health care provider. ? Avoid breastfeeding as directed by your health care provider.  Drink enough water and fluids to keep your urine clear or pale yellow.  Keep the area around your vagina and rectum clean. ? Wash the area daily with warm water. ? Wipe yourself from front to back after using the toilet.  Keep all follow-up visits as told by your health care provider. This is important. How is this prevented?  Do not douche.  Wash the outside of your vagina with warm water only.  Use protection when having sex. This includes latex condoms and dental dams.  Limit how many sexual partners you have. To help prevent bacterial vaginosis, it is best to have sex with just one partner (monogamous).  Make sure you and your sexual partner are tested for STIs.  Wear cotton or cotton-lined underwear.  Avoid wearing tight pants and pantyhose, especially during summer.  Limit the amount of alcohol that you drink.  Do not use any products that contain nicotine or tobacco, such as cigarettes and e-cigarettes. If you need help quitting, ask your health care provider.  Do not use illegal drugs. Where  to find more information:  Centers for Disease Control and Prevention: SolutionApps.co.zawww.cdc.gov/std  American Sexual Health Association (ASHA): www.ashastd.org  U.S. Department of Health and Health and safety inspectorHuman Services, Office on Women's Health: ConventionalMedicines.siwww.womenshealth.gov/ or http://www.anderson-williamson.info/https://www.womenshealth.gov/a-z-topics/bacterial-vaginosis Contact a health care provider if:  Your symptoms do not improve, even after treatment.  You have more discharge or pain when urinating.  You have a fever.  You have pain in your abdomen.  You have pain during sex.  You have vaginal bleeding between periods. Summary  Bacterial vaginosis is a vaginal infection that occurs when the normal balance of bacteria in the vagina is disrupted.  Because bacterial vaginosis increases your risk for STIs (sexually transmitted infections), getting treated can help reduce your risk for chlamydia, gonorrhea, herpes, and HIV (human immunodeficiency virus). Treatment is also important for preventing complications in pregnant women, because the condition can cause an early (premature) delivery.  This condition is treated with antibiotic medicines. These may be given as a pill, a vaginal cream, or a medicine that is put into the vagina (suppository). This information is not intended to replace advice given to you by your health care provider. Make sure you discuss any questions you have with your health care provider. Document Released: 09/01/2005 Document Revised: 05/17/2016 Document Reviewed: 05/17/2016 Elsevier Interactive Patient  Education  2017 [employer] Inc. Round Ligament Pain The round ligament is a cord of muscle and tissue that helps to support the uterus. It can become a source of pain during pregnancy if it becomes stretched or twisted as the baby grows. The pain usually begins in the second trimester of pregnancy, and it can come and go until the baby is delivered. It is not a serious problem, and it does not cause harm to the baby. Round  ligament pain is usually a short, sharp, and pinching pain, but it can also be a dull, lingering, and aching pain. The pain is felt in the lower side of the abdomen or in the groin. It usually starts deep in the groin and moves up to the outside of the hip area. Pain can occur with:  A sudden change in position.  Rolling over in bed.  Coughing or sneezing.  Physical activity.  Follow these instructions at home: Watch your condition for any changes. Take these steps to help with your pain:  When the pain starts, relax. Then try: ? Sitting down. ? Flexing your knees up to your abdomen. ? Lying on your side with one pillow under your abdomen and another pillow between your legs. ? Sitting in a warm bath for 15-20 minutes or until the pain goes away.  Take over-the-counter and prescription medicines only as told by your health care provider.  Move slowly when you sit and stand.  Avoid long walks if they cause pain.  Stop or lessen your physical activities if they cause pain.  Contact a health care provider if:  Your pain does not go away with treatment.  You feel pain in your back that you did not have before.  Your medicine is not helping. Get help right away if:  You develop a fever or chills.  You develop uterine contractions.  You develop vaginal bleeding.  You develop nausea or vomiting.  You develop diarrhea.  You have pain when you urinate. This information is not intended to replace advice given to you by your health care provider. Make sure you discuss any questions you have with your health care provider. Document Released: 06/10/2008 Document Revised: 02/07/2016 Document Reviewed: 11/08/2014 Elsevier Interactive Patient Education  [employer].

## 2017-02-13 LAB — GC/CHLAMYDIA PROBE AMP (~~LOC~~) NOT AT ARMC
Chlamydia: NEGATIVE
Neisseria Gonorrhea: NEGATIVE

## 2017-03-01 ENCOUNTER — Inpatient Hospital Stay (HOSPITAL_COMMUNITY)
Admission: AD | Admit: 2017-03-01 | Discharge: 2017-03-01 | Disposition: A | Discharge status: Home / Self Care | Payer: Medicaid Other | Source: Ambulatory Visit | Attending: Obstetrics & Gynecology | Admitting: Obstetrics & Gynecology

## 2017-03-01 ENCOUNTER — Inpatient Hospital Stay (HOSPITAL_COMMUNITY): Payer: Medicaid Other

## 2017-03-01 DIAGNOSIS — O9989 Other specified diseases and conditions complicating pregnancy, childbirth and the puerperium: Secondary | ICD-10-CM | POA: Diagnosis not present

## 2017-03-01 DIAGNOSIS — O26892 Other specified pregnancy related conditions, second trimester: Secondary | ICD-10-CM | POA: Diagnosis present

## 2017-03-01 DIAGNOSIS — Z3A18 18 weeks gestation of pregnancy: Secondary | ICD-10-CM | POA: Insufficient documentation

## 2017-03-01 DIAGNOSIS — N8311 Corpus luteum cyst of right ovary: Secondary | ICD-10-CM

## 2017-03-01 DIAGNOSIS — Z79899 Other long term (current) drug therapy: Secondary | ICD-10-CM | POA: Diagnosis not present

## 2017-03-01 DIAGNOSIS — R109 Unspecified abdominal pain: Secondary | ICD-10-CM | POA: Diagnosis not present

## 2017-03-01 DIAGNOSIS — O26899 Other specified pregnancy related conditions, unspecified trimester: Secondary | ICD-10-CM

## 2017-03-01 LAB — URINALYSIS, ROUTINE W REFLEX MICROSCOPIC
BILIRUBIN URINE: NEGATIVE
GLUCOSE, UA: NEGATIVE mg/dL
HGB URINE DIPSTICK: NEGATIVE
Ketones, ur: NEGATIVE mg/dL
Leukocytes, UA: NEGATIVE
Nitrite: NEGATIVE
Protein, ur: NEGATIVE mg/dL
SPECIFIC GRAVITY, URINE: 1.01 (ref 1.005–1.030)
pH: 6 (ref 5.0–8.0)

## 2017-03-01 LAB — CBC WITH DIFFERENTIAL/PLATELET
Basophils Absolute: 0 10*3/uL (ref 0.0–0.1)
Basophils Relative: 0 %
EOS ABS: 0 10*3/uL (ref 0.0–0.7)
Eosinophils Relative: 1 %
HCT: 36.4 % (ref 36.0–46.0)
Hemoglobin: 12.4 g/dL (ref 12.0–15.0)
LYMPHS ABS: 2.3 10*3/uL (ref 0.7–4.0)
LYMPHS PCT: 28 %
MCH: 26 pg (ref 26.0–34.0)
MCHC: 34.1 g/dL (ref 30.0–36.0)
MCV: 76.3 fL — AB (ref 78.0–100.0)
Monocytes Absolute: 0.2 10*3/uL (ref 0.1–1.0)
Monocytes Relative: 3 %
Neutro Abs: 5.6 10*3/uL (ref 1.7–7.7)
Neutrophils Relative %: 68 %
Platelets: 309 10*3/uL (ref 150–400)
RBC: 4.77 MIL/uL (ref 3.87–5.11)
RDW: 13.8 % (ref 11.5–15.5)
WBC: 8.1 10*3/uL (ref 4.0–10.5)

## 2017-03-01 MED ORDER — IBUPROFEN 600 MG PO TABS: 600.0000 mg | ORAL_TABLET | Freq: Four times a day (QID) | ORAL | 0 refills | Status: DC | PRN

## 2017-03-01 MED ORDER — IBUPROFEN 600 MG PO TABS
600.0000 mg | ORAL_TABLET | Freq: Once | ORAL | Status: AC
  Administered 2017-03-01: 600 mg via ORAL
  Filled 2017-03-01: qty 1

## 2017-03-01 NOTE — Progress Notes (Addendum)
G1 @ [redacted] wksga. Presents to triage for abdominal pain on and off and started getting worse last night. Denies LOF or bleeding. VSS see flow sheet for details.   Doppler: 153  1300: Provider at bs assessing pt 1312: SVE: ft  1315: U/S paged.   1415: provider at bs reassessing pt. Ordered for CBC and discharge.   Discharge instructions given with pt understanding.

## 2017-03-01 NOTE — MAU Provider Note (Signed)
History     CSN: [id number]  Arrival date and time: 03/01/17 1223   First Provider Initiated Contact with Patient 03/01/17 1304      Chief Complaint  Patient presents with  . Abdominal Pain   HPI   Ms.Donna Singh is a 21 y.o. female G1P0 @ 462w2d here in MAU with abdominal pain. The pain has been present for 3 weeks. The pain comes and goes. She is feeling the pain in her lower abdomen. On both sides. She denies vaginal bleeding. She was seen here for the pain a few weeks ago and feels that the pain is not improving. Pain currently rating her pain 9/10. States the pain has gotten worse since the last time she was seen.   OB History    Gravida Para Term Preterm AB Living   1             SAB TAB Ectopic Multiple Live Births                  Past Medical History:  Diagnosis Date  . Medical history non-contributory     Past Surgical History:  Procedure Laterality Date  . NO PAST SURGERIES      No family history on file.  Social History  Substance Use Topics  . Smoking status: Never Smoker  . Smokeless tobacco: Never Used  . Alcohol use No    Allergies: No Known Allergies  Prescriptions Prior to Admission  Medication Sig Dispense Refill Last Dose  . Prenatal Vit-Fe Fumarate-FA (PRENATAL MULTIVITAMIN) TABS tablet Take 1 tablet by mouth daily at 12 noon.   02/12/2017 at Unknown time   Results for orders placed or performed during the hospital encounter of 03/01/17 (from the past 48 hour(s))  Urinalysis, Routine w reflex microscopic     Status: None   Collection Time: 03/01/17 12:30 PM  Result Value Ref Range   Color, Urine YELLOW YELLOW   APPearance CLEAR CLEAR   Specific Gravity, Urine 1.010 1.005 - 1.030   pH 6.0 5.0 - 8.0   Glucose, UA NEGATIVE NEGATIVE mg/dL   Hgb urine dipstick NEGATIVE NEGATIVE   Bilirubin Urine NEGATIVE NEGATIVE   Ketones, ur NEGATIVE NEGATIVE mg/dL   Protein, ur NEGATIVE NEGATIVE mg/dL   Nitrite NEGATIVE NEGATIVE   Leukocytes, UA  NEGATIVE NEGATIVE  CBC with Differential     Status: Abnormal   Collection Time: 03/01/17  2:39 PM  Result Value Ref Range   WBC 8.1 4.0 - 10.5 K/uL   RBC 4.77 3.87 - 5.11 MIL/uL   Hemoglobin 12.4 12.0 - 15.0 g/dL   HCT 40.936.4 81.136.0 - 91.446.0 %   MCV 76.3 (L) 78.0 - 100.0 fL   MCH 26.0 26.0 - 34.0 pg   MCHC 34.1 30.0 - 36.0 g/dL   RDW 78.213.8 95.611.5 - 21.315.5 %   Platelets 309 150 - 400 K/uL   Neutrophils Relative % 68 %   Neutro Abs 5.6 1.7 - 7.7 K/uL   Lymphocytes Relative 28 %   Lymphs Abs 2.3 0.7 - 4.0 K/uL   Monocytes Relative 3 %   Monocytes Absolute 0.2 0.1 - 1.0 K/uL   Eosinophils Relative 1 %   Eosinophils Absolute 0.0 0.0 - 0.7 K/uL   Basophils Relative 0 %   Basophils Absolute 0.0 0.0 - 0.1 K/uL     Review of Systems  Constitutional: Negative for fever.  Gastrointestinal: Positive for abdominal distention and abdominal pain. Negative for constipation, diarrhea, nausea and vomiting.  Genitourinary:  Negative for dysuria and urgency.   Physical Exam   Blood pressure (!) 112/55, pulse 92, temperature 98.2 F (36.8 C), temperature source Oral, resp. rate 18, last menstrual period 10/24/2016.  Physical Exam  Constitutional: She is oriented to person, place, and time. She appears well-developed and well-nourished. No distress.  HENT:  Head: Normocephalic.  GI: Normal appearance. There is tenderness in the suprapubic area. There is no rigidity, no rebound and no guarding.  Genitourinary:  Genitourinary Comments: Dilation: Fingertip (external os), internal os closed  Exam by:: Ticia Virgo NP  Musculoskeletal: Normal range of motion.  Neurological: She is alert and oriented to person, place, and time.  Skin: Skin is warm. She is not diaphoretic.  Psychiatric: Her behavior is normal.    MAU Course  Procedures None  MDM  + fetal heart tones via doppler  US to evaluate cervical length> cervical length > 4 cm. Appears closed, without funneling.  UA & CBC Ibuprofen 600 mg  PO X 1 dose.  Pain down to 3/10 from 6/10  Assessment and Plan   A:  1. Corpus luteum cyst of right ovary   2. Abdominal pain affecting pregnancy     P;  Discharge home in stable condition Pregnancy support belt recommended Return to MAU if symptoms worsen  Rx: Ibuprofen. No ibuprofen use after [redacted] weeks gestation.    Duane Lope, NP 03/01/2017 3:19 PM

## 2017-03-01 NOTE — Discharge Instructions (Signed)

## 2017-03-01 NOTE — MAU Note (Signed)
Called lab to draw CBC at 2:30 pm in nursery will come asap.

## 2017-04-29 ENCOUNTER — Encounter (HOSPITAL_COMMUNITY): Payer: Self-pay | Admitting: Emergency Medicine

## 2017-04-29 ENCOUNTER — Emergency Department (HOSPITAL_COMMUNITY)
Admission: EM | Admit: 2017-04-29 | Discharge: 2017-04-29 | Disposition: A | Discharge status: Home / Self Care | Payer: Worker's Compensation | Attending: Emergency Medicine | Admitting: Emergency Medicine

## 2017-04-29 DIAGNOSIS — W010XXA Fall on same level from slipping, tripping and stumbling without subsequent striking against object, initial encounter: Secondary | ICD-10-CM | POA: Insufficient documentation

## 2017-04-29 DIAGNOSIS — O26892 Other specified pregnancy related conditions, second trimester: Secondary | ICD-10-CM | POA: Diagnosis not present

## 2017-04-29 DIAGNOSIS — Z3A27 27 weeks gestation of pregnancy: Secondary | ICD-10-CM | POA: Insufficient documentation

## 2017-04-29 DIAGNOSIS — W19XXXA Unspecified fall, initial encounter: Secondary | ICD-10-CM

## 2017-04-29 DIAGNOSIS — Z791 Long term (current) use of non-steroidal anti-inflammatories (NSAID): Secondary | ICD-10-CM | POA: Insufficient documentation

## 2017-04-29 DIAGNOSIS — M25562 Pain in left knee: Secondary | ICD-10-CM | POA: Diagnosis present

## 2017-04-29 DIAGNOSIS — Y9389 Activity, other specified: Secondary | ICD-10-CM | POA: Insufficient documentation

## 2017-04-29 DIAGNOSIS — Y999 Unspecified external cause status: Secondary | ICD-10-CM | POA: Insufficient documentation

## 2017-04-29 DIAGNOSIS — Y929 Unspecified place or not applicable: Secondary | ICD-10-CM | POA: Diagnosis not present

## 2017-04-29 NOTE — ED Notes (Signed)
Pt's paperwork filled out by PA.  Pt may go home now.

## 2017-04-29 NOTE — ED Notes (Signed)
Pt is workers comp 

## 2017-04-29 NOTE — ED Notes (Signed)
Patient asking about her worker's comp paperwork.  PA notified.

## 2017-04-29 NOTE — ED Provider Notes (Signed)
[facility]PT Provider Note   CSN: [id number] Arrival date & time: 04/29/17  1904  By signing my name below, Singh, Donna Singh, attest that this documentation has been prepared under the direction and in the presence of non-physician practitioner, Donna Singh, Donna Beadnell, PA-C. Electronically Signed: Rosana Fretana Singh, ED Scribe. 04/29/17. 7:44 PM.  History   Chief Complaint Chief Complaint  Patient presents with  . Fall   The history is provided by the patient. No language interpreter was used.   HPI Comments: Donna Singh is a 5127 week pregnant 21 y.o. female who presents to the Emergency Department via EMS after sustaining a mechanical, ground-level fall 1 hour ago. Pt slipped on a wet carpet floor and landed directly on her left knee. No head injury, LOC or abdominal trauma. Pt complains of pain in her left knee. No prior hx of injury or trauma to the knee. Pt has not ambulated since the fall due to pain. Pt denies numbness/tingling or any other complaints at this time.  OBPenelope Singh: Donna Singh  Past Medical History:  Diagnosis Date  . Medical history non-contributory     There are no active problems to display for this patient.   Past Surgical History:  Procedure Laterality Date  . NO PAST SURGERIES      OB History    Gravida Para Term Preterm AB Living   1             SAB TAB Ectopic Multiple Live Births                   Home Medications    Prior to Admission medications   Medication Sig Start Date End Date Taking? Authorizing Provider  ibuprofen (ADVIL,MOTRIN) 600 MG tablet Take 1 tablet (600 mg total) by mouth every 6 (six) hours as needed. 03/01/17   Donna Singh, Donna RutherfordJennifer I, NP  Prenatal Vit-Fe Fumarate-FA (PRENATAL MULTIVITAMIN) TABS tablet Take 1 tablet by mouth daily.     [provider]    Family History No family history on file.  Social History Social History  Substance Use Topics  . Smoking status: Never Smoker  . Smokeless tobacco: Never Used  . Alcohol use  No     Allergies   Patient has no known allergies.   Review of Systems Review of Systems  Gastrointestinal: Negative for abdominal pain, nausea and vomiting.  Musculoskeletal: Positive for arthralgias and myalgias.  Neurological: Negative for syncope and numbness.     Physical Exam Updated Vital Signs BP 106/72   Pulse 93   Temp 98.4 F (36.9 C) (Oral)   Resp 20   Ht 5\' 3"  (1.6 m)   Wt 84.8 kg (187 lb)   LMP 10/24/2016   SpO2 100%   BMI 33.13 kg/m   Physical Exam  Constitutional: She is oriented to person, place, and time. She appears well-developed and well-nourished. No distress.  Non-toxic appearing  HENT:  Head: Normocephalic and atraumatic.  Right Ear: External ear normal.  Left Ear: External ear normal.  Eyes: Right eye exhibits no discharge. Left eye exhibits no discharge.  Neck: Normal range of motion. Neck supple. No spinous process tenderness present. No neck rigidity. Normal range of motion present.  Cardiovascular: Normal rate and intact distal pulses.   Pulses:      Radial pulses are 2+ on the right side, and 2+ on the left side.       Femoral pulses are 2+ on the right side, and 2+ on the left side.  Dorsalis pedis pulses are 2+ on the right side, and 2+ on the left side.       Posterior tibial pulses are 2+ on the right side, and 2+ on the left side.  Pulmonary/Chest: Effort normal. No respiratory distress.  Abdominal: Soft. She exhibits no distension and no pulsatile midline mass. There is no tenderness. There is no rigidity, no rebound and no CVA tenderness.  Musculoskeletal:       Left hip: Normal.       Left ankle: Normal.  Left knee: Appearance normal. No obvious deformity. Point tenderness over patella. No joint line tenderness. Decreased flexion and extension due to pain. Bilateral lower extremity strength able and appropriate but mildly limited due to pain. Sensation of lower extremities grossly intact. Straight leg left unable to be  performed. Lower extremity compartments soft. PT and DP 2+ b/l.    Neurological: She is alert and oriented to person, place, and time.  Skin: Skin is warm, dry and intact. Capillary refill takes less than 2 seconds. No rash noted. She is not diaphoretic. No erythema.  Psychiatric: She has a normal mood and affect.  Nursing note and vitals reviewed.    ED Treatments / Results  DIAGNOSTIC STUDIES: Oxygen Saturation is 100% on RA, normal by my interpretation.   COORDINATION OF CARE: 7:43 PM-Discussed next steps with pt. Pt verbalized understanding and is agreeable with the plan.   Labs (all labs ordered are listed, but only abnormal results are displayed) Labs Reviewed - No data to display  EKG  EKG Interpretation None       Radiology No results found.  Procedures Procedures (including critical care time)  Medications Ordered in ED Medications - No data to display   Initial Impression / Assessment and Plan / ED Course  Singh have reviewed the triage vital signs and the nursing notes.  Pertinent labs & imaging results that were available during my care of the patient were reviewed by me and considered in my medical decision making (see chart for details).     This is a 21 year old female that is [redacted] weeks pregnant who presents to the emergency department today after a mechanical fall on a wet carpet and caused her to strike her left knee directly onto the floor. No trauma to patient's abdomen no belly pain on presentation. On exam there is concern for patellar fracture as the patient has point tenderness over the patella and is unable to do a straight leg raise. Singh discussed with the patient that x-rays would be needed nor to diagnose this but there is the risk of radiation as she is pregnant. The patient opted to be treated as if the patella is fractured and not received x-rays at this time. Will place the patient in a knee immobilizer and have her follow-up with orthopedics. Singh  advised her to follow Rice therapy. Patient is neurovascularly intact distally after application of the knee immobilizer. Singh advised the patient to return to the emergency department with new or worsening symptoms or new concerns. Specific return precautions discussed. The patient verbalized understanding and agreement with plan. All questions answered. No further questions at this time. The patient is hemodynamically stable, mentating appropriately and appears safe for discharge.  Patient case discussed with Dr. Adriana Simas who is in agreement with plan.   Final Clinical Impressions(s) / ED Diagnoses   Final diagnoses:  Fall, initial encounter  Acute pain of left knee    New Prescriptions New Prescriptions   No medications  on file   Singh personally performed the services described in this documentation, which was scribed in my presence. The recorded information has been reviewed and is accurate.       Princella Pellegrini 04/29/17 2217    Donnetta Hutching, MD 05/02/17 [phone]

## 2017-04-29 NOTE — Progress Notes (Signed)
Orthopedic Tech Progress Note Patient Details:  Donna PoissonMonica Swavely 08/02/1996 [id number]  Ortho Devices Type of Ortho Device: Knee Immobilizer Ortho Device/Splint Location: applied knee immoblizer to pt left knee/leg.  pt tolerated application very well.  Left knee Ortho Device/Splint Interventions: Application, Adjustment   Alvina ChouWilliams, [name] 04/29/2017, 8:58 PM
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
## 2017-04-29 NOTE — Discharge Instructions (Signed)
As we discussed, due to your exam findings, there is some concern for patellar fracture. We discussed that xrays can be done at this time but after discussion, the decision was made to treat this as such without xrays. I would like you to follow up with ortho this week. I have placed you in a knee immobilizer that will keep your knee in extension (straight, but not hyperextension). Please follow up with orthopedics this week. Please follow Rice therapy (attached). Please follow up with your OB this week and discuss with them what happened.

## 2017-06-19 ENCOUNTER — Encounter (HOSPITAL_COMMUNITY): Payer: Self-pay | Admitting: Emergency Medicine

## 2017-06-19 ENCOUNTER — Inpatient Hospital Stay (HOSPITAL_COMMUNITY)
Admission: AD | Admit: 2017-06-19 | Discharge: 2017-06-20 | Disposition: A | Discharge status: Home / Self Care | Payer: No Typology Code available for payment source | Source: Ambulatory Visit | Attending: Obstetrics and Gynecology | Admitting: Obstetrics and Gynecology

## 2017-06-19 ENCOUNTER — Emergency Department (HOSPITAL_COMMUNITY)
Admission: EM | Admit: 2017-06-19 | Discharge: 2017-06-19 | Disposition: A | Discharge status: Other Acute Inpatient Hospital | Payer: No Typology Code available for payment source | Attending: Emergency Medicine | Admitting: Emergency Medicine

## 2017-06-19 ENCOUNTER — Encounter (HOSPITAL_COMMUNITY): Payer: Self-pay | Admitting: *Deleted

## 2017-06-19 DIAGNOSIS — Z3A34 34 weeks gestation of pregnancy: Secondary | ICD-10-CM

## 2017-06-19 DIAGNOSIS — R1030 Lower abdominal pain, unspecified: Secondary | ICD-10-CM | POA: Insufficient documentation

## 2017-06-19 DIAGNOSIS — Y939 Activity, unspecified: Secondary | ICD-10-CM | POA: Diagnosis not present

## 2017-06-19 DIAGNOSIS — O26893 Other specified pregnancy related conditions, third trimester: Secondary | ICD-10-CM | POA: Insufficient documentation

## 2017-06-19 DIAGNOSIS — Y92410 Unspecified street and highway as the place of occurrence of the external cause: Secondary | ICD-10-CM | POA: Insufficient documentation

## 2017-06-19 DIAGNOSIS — Y999 Unspecified external cause status: Secondary | ICD-10-CM | POA: Diagnosis not present

## 2017-06-19 DIAGNOSIS — O9A213 Injury, poisoning and certain other consequences of external causes complicating pregnancy, third trimester: Secondary | ICD-10-CM | POA: Insufficient documentation

## 2017-06-19 LAB — BASIC METABOLIC PANEL
Anion gap: 9 (ref 5–15)
BUN: 5 mg/dL — AB (ref 6–20)
CALCIUM: 9.4 mg/dL (ref 8.9–10.3)
CO2: 22 mmol/L (ref 22–32)
CREATININE: 0.66 mg/dL (ref 0.44–1.00)
Chloride: 104 mmol/L (ref 101–111)
GFR calc Af Amer: >60 mL/min (ref >60)
Glucose, Bld: 120 mg/dL — ABNORMAL HIGH (ref 65–99)
POTASSIUM: 3.3 mmol/L — AB (ref 3.5–5.1)
SODIUM: 135 mmol/L (ref 135–145)

## 2017-06-19 LAB — CBC WITH DIFFERENTIAL/PLATELET
Basophils Absolute: 0 10*3/uL (ref 0.0–0.1)
Basophils Relative: 0 %
EOS ABS: 0.1 10*3/uL (ref 0.0–0.7)
EOS PCT: 0 %
HCT: 34.5 % — ABNORMAL LOW (ref 36.0–46.0)
Hemoglobin: 11.6 g/dL — ABNORMAL LOW (ref 12.0–15.0)
LYMPHS ABS: 2.9 10*3/uL (ref 0.7–4.0)
LYMPHS PCT: 25 %
MCH: 25.7 pg — AB (ref 26.0–34.0)
MCHC: 33.6 g/dL (ref 30.0–36.0)
MCV: 76.3 fL — AB (ref 78.0–100.0)
MONO ABS: 0.8 10*3/uL (ref 0.1–1.0)
Monocytes Relative: 7 %
Neutro Abs: 8.2 10*3/uL — ABNORMAL HIGH (ref 1.7–7.7)
Neutrophils Relative %: 68 %
PLATELETS: 334 10*3/uL (ref 150–400)
RBC: 4.52 MIL/uL (ref 3.87–5.11)
RDW: 13.6 % (ref 11.5–15.5)
WBC: 12 10*3/uL — AB (ref 4.0–10.5)

## 2017-06-19 MED ORDER — SODIUM CHLORIDE 0.9 % IV BOLUS (SEPSIS)
1000.0000 mL | Freq: Once | INTRAVENOUS | Status: AC
  Administered 2017-06-19: 1000 mL via INTRAVENOUS

## 2017-06-19 NOTE — ED Notes (Signed)
This note also relates to the following rows which could not be included: Pulse Rate - Cannot attach notes to unvalidated device data SpO2 - Cannot attach notes to unvalidated device data  Patient sent to MAU per [facility]
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
## 2017-06-19 NOTE — ED Notes (Signed)
[facility] contacted to transport patient to MAU 

## 2017-06-19 NOTE — Discharge Instructions (Signed)
Transfer to [facility]om[facility].  °
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
## 2017-06-19 NOTE — Progress Notes (Signed)
[facility] in, patient transferred MAU.
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
## 2017-06-20 ENCOUNTER — Inpatient Hospital Stay (HOSPITAL_COMMUNITY): Payer: No Typology Code available for payment source

## 2017-06-20 DIAGNOSIS — O9989 Other specified diseases and conditions complicating pregnancy, childbirth and the puerperium: Secondary | ICD-10-CM | POA: Diagnosis not present

## 2017-06-20 DIAGNOSIS — O9A213 Injury, poisoning and certain other consequences of external causes complicating pregnancy, third trimester: Secondary | ICD-10-CM | POA: Diagnosis not present
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
## 2017-06-20 NOTE — Discharge Instructions (Signed)
Third Trimester of Pregnancy The third trimester is from week 28 through week 40 (months 7 through 9). The third trimester is a time when the unborn baby (fetus) is growing rapidly. At the end of the ninth month, the fetus is about 20 inches in length and weighs 6-10 pounds. Body changes during your third trimester Your body will continue to go through many changes during pregnancy. The changes vary from woman to woman. During the third trimester:  Your weight will continue to increase. You can expect to gain 25-35 pounds (11-16 kg) by the end of the pregnancy.  You may begin to get stretch marks on your hips, abdomen, and breasts.  You may urinate more often because the fetus is moving lower into your pelvis and pressing on your bladder.  You may develop or continue to have heartburn. This is caused by increased hormones that slow down muscles in the digestive tract.  You may develop or continue to have constipation because increased hormones slow digestion and cause the muscles that push waste through your intestines to relax.  You may develop hemorrhoids. These are swollen veins (varicose veins) in the rectum that can itch or be painful.  You may develop swollen, bulging veins (varicose veins) in your legs.  You may have increased body aches in the pelvis, back, or thighs. This is due to weight gain and increased hormones that are relaxing your joints.  You may have changes in your hair. These can include thickening of your hair, rapid growth, and changes in texture. Some women also have hair loss during or after pregnancy, or hair that feels dry or thin. Your hair will most likely return to normal after your baby is born.  Your breasts will continue to grow and they will continue to become tender. A yellow fluid (colostrum) may leak from your breasts. This is the first milk you are producing for your baby.  Your belly button may stick out.  You may notice more swelling in your hands,  face, or ankles.  You may have increased tingling or numbness in your hands, arms, and legs. The skin on your belly may also feel numb.  You may feel short of breath because of your expanding uterus.  You may have more problems sleeping. This can be caused by the size of your belly, increased need to urinate, and an increase in your body's metabolism.  You may notice the fetus "dropping," or moving lower in your abdomen (lightening).  You may have increased vaginal discharge.  You may notice your joints feel loose and you may have pain around your pelvic bone.  What to expect at prenatal visits You will have prenatal exams every 2 weeks until week 36. Then you will have weekly prenatal exams. During a routine prenatal visit:  You will be weighed to make sure you and the baby are growing normally.  Your blood pressure will be taken.  Your abdomen will be measured to track your baby's growth.  The fetal heartbeat will be listened to.  Any test results from the previous visit will be discussed.  You may have a cervical check near your due date to see if your cervix has softened or thinned (effaced).  You will be tested for Group B streptococcus. This happens between 35 and 37 weeks.  Your health care provider may ask you:  What your birth plan is.  How you are feeling.  If you are feeling the baby move.  If you have had   any abnormal symptoms, such as leaking fluid, bleeding, severe headaches, or abdominal cramping.  If you are using any tobacco products, including cigarettes, chewing tobacco, and electronic cigarettes.  If you have any questions.  Other tests or screenings that may be performed during your third trimester include:  Blood tests that check for low iron levels (anemia).  Fetal testing to check the health, activity level, and growth of the fetus. Testing is done if you have certain medical conditions or if there are problems during the  pregnancy.  Nonstress test (NST). This test checks the health of your baby to make sure there are no signs of problems, such as the baby not getting enough oxygen. During this test, a belt is placed around your belly. The baby is made to move, and its heart rate is monitored during movement.  What is false labor? False labor is a condition in which you feel small, irregular tightenings of the muscles in the womb (contractions) that usually go away with rest, changing position, or drinking water. These are called Braxton Hicks contractions. Contractions may last for hours, days, or even weeks before true labor sets in. If contractions come at regular intervals, become more frequent, increase in intensity, or become painful, you should see your health care provider. What are the signs of labor?  Abdominal cramps.  Regular contractions that start at 10 minutes apart and become stronger and more frequent with time.  Contractions that start on the top of the uterus and spread down to the lower abdomen and back.  Increased pelvic pressure and dull back pain.  A watery or bloody mucus discharge that comes from the vagina.  Leaking of amniotic fluid. This is also known as your "water breaking." It could be a slow trickle or a gush. Let your health care provider know if it has a color or strange odor. If you have any of these signs, call your health care provider right away, even if it is before your due date. Follow these instructions at home: Medicines  Follow your health care provider's instructions regarding medicine use. Specific medicines may be either safe or unsafe to take during pregnancy.  Take a prenatal vitamin that contains at least 600 micrograms (mcg) of folic acid.  If you develop constipation, try taking a stool softener if your health care provider approves. Eating and drinking  Eat a balanced diet that includes fresh fruits and vegetables, whole grains, good sources of protein  such as meat, eggs, or tofu, and low-fat dairy. Your health care provider will help you determine the amount of weight gain that is right for you.  Avoid raw meat and uncooked cheese. These carry germs that can cause birth defects in the baby.  If you have low calcium intake from food, talk to your health care provider about whether you should take a daily calcium supplement.  Eat four or five small meals rather than three large meals a day.  Limit foods that are high in fat and processed sugars, such as fried and sweet foods.  To prevent constipation: ? Drink enough fluid to keep your urine clear or pale yellow. ? Eat foods that are high in fiber, such as fresh fruits and vegetables, whole grains, and beans. Activity  Exercise only as directed by your health care provider. Most women can continue their usual exercise routine during pregnancy. Try to exercise for 30 minutes at least 5 days a week. Stop exercising if you experience uterine contractions.  Avoid heavy   lifting.  Do not exercise in extreme heat or humidity, or at high altitudes.  Wear low-heel, comfortable shoes.  Practice good posture.  You may continue to have sex unless your health care provider tells you otherwise. Relieving pain and discomfort  Take frequent breaks and rest with your legs elevated if you have leg cramps or low back pain.  Take warm sitz baths to soothe any pain or discomfort caused by hemorrhoids. Use hemorrhoid cream if your health care provider approves.  Wear a good support bra to prevent discomfort from breast tenderness.  If you develop varicose veins: ? Wear support pantyhose or compression stockings as told by your healthcare provider. ? Elevate your feet for 15 minutes, 3-4 times a day. Prenatal care  Write down your questions. Take them to your prenatal visits.  Keep all your prenatal visits as told by your health care provider. This is important. Safety  Wear your seat belt at  all times when driving.  Make a list of emergency phone numbers, including numbers for family, friends, the hospital, and police and fire departments. General instructions  Avoid cat litter boxes and soil used by cats. These carry germs that can cause birth defects in the baby. If you have a cat, ask someone to clean the litter box for you.  Do not travel far distances unless it is absolutely necessary and only with the approval of your health care provider.  Do not use hot tubs, steam rooms, or saunas.  Do not drink alcohol.  Do not use any products that contain nicotine or tobacco, such as cigarettes and e-cigarettes. If you need help quitting, ask your health care provider.  Do not use any medicinal herbs or unprescribed drugs. These chemicals affect the formation and growth of the baby.  Do not douche or use tampons or scented sanitary pads.  Do not cross your legs for long periods of time.  To prepare for the arrival of your baby: ? Take prenatal classes to understand, practice, and ask questions about labor and delivery. ? Make a trial run to the hospital. ? Visit the hospital and tour the maternity area. ? Arrange for maternity or paternity leave through employers. ? Arrange for family and friends to take care of pets while you are in the hospital. ? Purchase a rear-facing car seat and make sure you know how to install it in your car. ? Pack your hospital bag. ? Prepare the baby's nursery. Make sure to remove all pillows and stuffed animals from the baby's crib to prevent suffocation.  Visit your dentist if you have not gone during your pregnancy. Use a soft toothbrush to brush your teeth and be gentle when you floss. Contact a health care provider if:  You are unsure if you are in labor or if your water has broken.  You become dizzy.  You have mild pelvic cramps, pelvic pressure, or nagging pain in your abdominal area.  You have lower back pain.  You have persistent  nausea, vomiting, or diarrhea.  You have an unusual or bad smelling vaginal discharge.  You have pain when you urinate. Get help right away if:  Your water breaks before 37 weeks.  You have regular contractions less than 5 minutes apart before 37 weeks.  You have a fever.  You are leaking fluid from your vagina.  You have spotting or bleeding from your vagina.  You have severe abdominal pain or cramping.  You have rapid weight loss or weight gain.    You have shortness of breath with chest pain.  You notice sudden or extreme swelling of your face, hands, ankles, feet, or legs.  Your baby makes fewer than 10 movements in 2 hours.  You have severe headaches that do not go away when you take medicine.  You have vision changes. Summary  The third trimester is from week 28 through week 40, months 7 through 9. The third trimester is a time when the unborn baby (fetus) is growing rapidly.  During the third trimester, your discomfort may increase as you and your baby continue to gain weight. You may have abdominal, leg, and back pain, sleeping problems, and an increased need to urinate.  During the third trimester your breasts will keep growing and they will continue to become tender. A yellow fluid (colostrum) may leak from your breasts. This is the first milk you are producing for your baby.  False labor is a condition in which you feel small, irregular tightenings of the muscles in the womb (contractions) that eventually go away. These are called Braxton Hicks contractions. Contractions may last for hours, days, or even weeks before true labor sets in.  Signs of labor can include: abdominal cramps; regular contractions that start at 10 minutes apart and become stronger and more frequent with time; watery or bloody mucus discharge that comes from the vagina; increased pelvic pressure and dull back pain; and leaking of amniotic fluid. This information is not intended to replace advice  given to you by your health care provider. Make sure you discuss any questions you have with your health care provider. Document Released: 08/26/2001 Document Revised: 02/07/2016 Document Reviewed: 11/02/2012 Elsevier Interactive Patient Education  2017 [employer]  

## 2017-06-20 NOTE — Progress Notes (Signed)
EFM d/ced for pt to go home.

## 2017-06-20 NOTE — Progress Notes (Signed)
Written and verbal d/c instructions given and understanding voiced. 

## 2017-06-29 LAB — OB RESULTS CONSOLE GBS: GBS: POSITIVE

## 2017-07-25 ENCOUNTER — Other Ambulatory Visit: Payer: Self-pay

## 2017-07-25 ENCOUNTER — Encounter (HOSPITAL_COMMUNITY): Payer: Self-pay | Admitting: Emergency Medicine

## 2017-07-25 ENCOUNTER — Emergency Department (HOSPITAL_COMMUNITY)
Admission: EM | Admit: 2017-07-25 | Discharge: 2017-07-25 | Disposition: A | Discharge status: Home / Self Care | Payer: Medicaid Other | Attending: Emergency Medicine | Admitting: Emergency Medicine

## 2017-07-25 DIAGNOSIS — Z79899 Other long term (current) drug therapy: Secondary | ICD-10-CM | POA: Diagnosis not present

## 2017-07-25 DIAGNOSIS — O9989 Other specified diseases and conditions complicating pregnancy, childbirth and the puerperium: Secondary | ICD-10-CM | POA: Insufficient documentation

## 2017-07-25 DIAGNOSIS — Z3A39 39 weeks gestation of pregnancy: Secondary | ICD-10-CM | POA: Insufficient documentation

## 2017-07-25 DIAGNOSIS — K029 Dental caries, unspecified: Secondary | ICD-10-CM | POA: Diagnosis not present

## 2017-07-25 DIAGNOSIS — K0889 Other specified disorders of teeth and supporting structures: Secondary | ICD-10-CM | POA: Diagnosis not present

## 2017-07-25 MED ORDER — ACETAMINOPHEN 325 MG PO TABS: 650.0000 mg | ORAL_TABLET | Freq: Three times a day (TID) | ORAL | 0 refills | Status: DC | PRN

## 2017-07-25 MED ORDER — LIDOCAINE VISCOUS 2 % MT SOLN: 20.0000 mL | OROMUCOSAL | 0 refills | Status: DC | PRN

## 2017-07-25 MED ORDER — PENICILLIN V POTASSIUM 500 MG PO TABS
500.0000 mg | ORAL_TABLET | Freq: Four times a day (QID) | ORAL | 0 refills | Status: AC
Start: 2017-07-25 — End: 2017-07-30

## 2017-07-25 NOTE — Discharge Instructions (Signed)
Take Tylenol as needed for pain.  Do not take ibuprofen, as this can cause harm to your baby. Use the viscous lidocaine as needed for pain.  This is a mouthwash. Take all the penicillin as prescribed.  Complete the entire course, even if you are feeling better. There is information about dentists in the area in this paperwork.  You may contact them if pain persists and for further evaluation of your teeth. Return to the emergency room if you develop fevers, chills, difficulty swallowing, difficulty opening her mouth, or any new or worsening symptoms.

## 2017-07-25 NOTE — Progress Notes (Signed)
[facility] Pt is [redacted] weeks pregnant with no OB complaints.Says they have obtained FHR of 138 BPM and pt presents with a toothache. Dr. Debroah LoopArnold notified and feels comfortable with OB RR RN not going over to see the pt.

## 2017-07-25 NOTE — ED Notes (Signed)
EDP to see and assess patient prior to RN assessment. See PA-C note.  

## 2017-07-25 NOTE — ED Triage Notes (Addendum)
Pt states left lower dental pain and upper dental/ gum pain for 3 days. Also [redacted] weeks pregnant, no pregnancy issues. Fetal heart tone 138 Rapid Ob Mary aware pt is here with good fetal heart tones and no OB complaints.

## 2017-07-25 NOTE — ED Notes (Signed)
Patient verbalized understanding of discharge instructions and denies any further needs or questions at this time. VS stable. Patient ambulatory with steady gait.  

## 2017-07-25 NOTE — ED Provider Notes (Signed)
[facility]   CSN: [id number] Arrival date & time: 07/25/17  1757     History   Chief Complaint Chief Complaint  Patient presents with  . Dental Pain    HPI Donna Singh is a 21 y.o. female presenting with tooth pain.  Pt states that 2 days ago, she started to develop pain along the gum of her left lower jaw.  Yesterday she started to have some pain of the gum of the upper front teeth.  She reports a history of needing teeth pulled in the past.  She took 1 of her amoxicillin left over from her previous dental extraction, but this did not help with her pain.  She denies fevers, chills, trismus, difficulty swallowing, nausea, or vomiting.  She is [redacted] weeks pregnant, there have been no complications.  She has no other medical problems.  Does not take any other medicines daily.  HPI  Past Medical History:  Diagnosis Date  . Medical history non-contributory     There are no active problems to display for this patient.   Past Surgical History:  Procedure Laterality Date  . NO PAST SURGERIES      OB History    Gravida Para Term Preterm AB Living   1             SAB TAB Ectopic Multiple Live Births                   Home Medications    Prior to Admission medications   Medication Sig Start Date End Date Taking? Authorizing Provider  acetaminophen (TYLENOL) 325 MG tablet Take 2 tablets (650 mg total) every 8 (eight) hours as needed by mouth. 07/25/17   Jearl Soto, PA-C  lidocaine (XYLOCAINE) 2 % solution Use as directed 20 mLs as needed in the mouth or throat for mouth pain. 07/25/17   Margarite Vessel, PA-C  penicillin v potassium (VEETID) 500 MG tablet Take 1 tablet (500 mg total) 4 (four) times daily for 5 days by mouth. 07/25/17 07/30/17  Francena Zender, PA-C  Prenatal Vit-Fe Fumarate-FA (PRENATAL MULTIVITAMIN) TABS tablet Take 1 tablet by mouth daily.     [provider]    Family History No  family history on file.  Social History Social History   Tobacco Use  . Smoking status: Never Smoker  . Smokeless tobacco: Never Used  Substance Use Topics  . Alcohol use: No  . Drug use: No     Allergies   Patient has no known allergies.   Review of Systems Review of Systems  Constitutional: Negative for chills and fever.  HENT: Positive for dental problem.      Physical Exam Updated Vital Signs BP 110/78 (BP Location: Left Arm)   Pulse 92   Temp 98.7 F (37.1 C) (Oral)   Resp 16   LMP 10/24/2016   SpO2 100%   Physical Exam  Constitutional: She is oriented to person, place, and time. She appears well-developed and well-nourished. No distress.  HENT:  Head: Normocephalic and atraumatic.  Mouth/Throat: Uvula is midline, oropharynx is clear and moist and mucous membranes are normal. No oral lesions. No trismus in the jaw. Abnormal dentition. Dental caries present. No dental abscesses or uvula swelling.    Multiple dental caries.  Tooth extraction from left lower gum noted.  Gingival edema and swelling around left lower gum.  Tenderness to palpation of multiple teeth.  Additional sensitivity of upper frontal gingiva.  No  obvious abscess noted.  No trismus.  Patient handling secretions easily.  Uvula midline with equal palate rise.  No pain under the tongue.  Eyes: EOM are normal.  Neck: Normal range of motion.  Pulmonary/Chest: Effort normal.  Abdominal: She exhibits no distension.  Musculoskeletal: Normal range of motion.  Neurological: She is alert and oriented to person, place, and time.  Skin: Skin is warm. No rash noted.  Psychiatric: She has a normal mood and affect.  Nursing note and vitals reviewed.    ED Treatments / Results  Labs (all labs ordered are listed, but only abnormal results are displayed) Labs Reviewed - No data to display  EKG  EKG Interpretation None       Radiology No results found.  Procedures Procedures (including critical  care time)  Medications Ordered in ED Medications - No data to display   Initial Impression / Assessment and Plan / ED Course  I have reviewed the triage vital signs and the nursing notes.  Pertinent labs & imaging results that were available during my care of the patient were reviewed by me and considered in my medical decision making (see chart for details).     She presented with 2-day history of tooth/gum pain.  She is afebrile not tachycardic.  No signs of systemic infection.  Physical exam shows gingival swelling and erythema of left lower side.  No acute abnormality other than pain noted of upper gum.  Doubt Ludwig's.  Will treat with Tylenol, viscous lidocaine, and antibiotic.  Gust with patient that she is not to take ibuprofen or other NSAIDs.  Discussed patient should not take antibiotics not prescribed to her during pregnancy, as this could be harmful.  Patient to follow-up with dentist as needed.  At this time, patient appears safe for discharge.  Return precautions given.  Patient states she understands and agrees to plan.   Final Clinical Impressions(s) / ED Diagnoses   Final diagnoses:  Pain, dental    ED Discharge Orders        Ordered    penicillin v potassium (VEETID) 500 MG tablet  4 times daily     07/25/17 1932    lidocaine (XYLOCAINE) 2 % solution  As needed     07/25/17 1932    acetaminophen (TYLENOL) 325 MG tablet  Every 8 hours PRN     07/25/17 1932       Alveria ApleyCaccavale, Mazey Mantell, PA-C 07/26/17 0026    Eber HongMiller, Brian, MD 07/26/17 [phone]

## 2017-07-28 ENCOUNTER — Encounter (HOSPITAL_COMMUNITY): Payer: Self-pay | Admitting: *Deleted

## 2017-07-28 ENCOUNTER — Inpatient Hospital Stay (HOSPITAL_COMMUNITY)
Admission: AD | Admit: 2017-07-28 | Discharge: 2017-07-29 | Disposition: A | Discharge status: Home / Self Care | Payer: Medicaid Other | Source: Ambulatory Visit | Attending: Family Medicine | Admitting: Family Medicine

## 2017-07-28 DIAGNOSIS — O479 False labor, unspecified: Secondary | ICD-10-CM

## 2017-07-28 NOTE — MAU Note (Signed)
Pt reports uc's q 5 minutes. Denies LOF. + FM

## 2017-07-28 NOTE — MAU Note (Signed)
PT SAYS UC  HURT BAD SINCE  5  PM.   TODAY VE IN HD-   1-2   CM.    DENIES HSV AND  MRSA.  GBS- POSITIVE

## 2017-07-29 ENCOUNTER — Encounter (HOSPITAL_COMMUNITY)
Admission: AD | Disposition: A | Discharge status: Home / Self Care | Payer: Self-pay | Source: Ambulatory Visit | Attending: Obstetrics and Gynecology

## 2017-07-29 ENCOUNTER — Inpatient Hospital Stay (HOSPITAL_COMMUNITY): Payer: Medicaid Other | Admitting: Anesthesiology

## 2017-07-29 ENCOUNTER — Encounter (HOSPITAL_COMMUNITY): Payer: Self-pay | Admitting: *Deleted

## 2017-07-29 ENCOUNTER — Inpatient Hospital Stay (HOSPITAL_COMMUNITY)
Admission: AD | Admit: 2017-07-29 | Discharge: 2017-08-01 | DRG: 788 | Disposition: A | Discharge status: Home / Self Care | Payer: Medicaid Other | Source: Ambulatory Visit | Attending: Obstetrics and Gynecology | Admitting: Obstetrics and Gynecology

## 2017-07-29 DIAGNOSIS — D573 Sickle-cell trait: Secondary | ICD-10-CM | POA: Diagnosis present

## 2017-07-29 DIAGNOSIS — Z3A39 39 weeks gestation of pregnancy: Secondary | ICD-10-CM

## 2017-07-29 DIAGNOSIS — O99824 Streptococcus B carrier state complicating childbirth: Secondary | ICD-10-CM | POA: Diagnosis present

## 2017-07-29 DIAGNOSIS — Z98891 History of uterine scar from previous surgery: Secondary | ICD-10-CM

## 2017-07-29 DIAGNOSIS — O9902 Anemia complicating childbirth: Secondary | ICD-10-CM | POA: Diagnosis present

## 2017-07-29 DIAGNOSIS — Z3483 Encounter for supervision of other normal pregnancy, third trimester: Secondary | ICD-10-CM | POA: Diagnosis present

## 2017-07-29 DIAGNOSIS — B951 Streptococcus, group B, as the cause of diseases classified elsewhere: Secondary | ICD-10-CM | POA: Diagnosis present

## 2017-07-29 DIAGNOSIS — O479 False labor, unspecified: Secondary | ICD-10-CM

## 2017-07-29 LAB — CBC
HEMATOCRIT: 36.5 % (ref 36.0–46.0)
HEMOGLOBIN: 12.6 g/dL (ref 12.0–15.0)
MCH: 25.5 pg — AB (ref 26.0–34.0)
MCHC: 34.5 g/dL (ref 30.0–36.0)
MCV: 73.9 fL — ABNORMAL LOW (ref 78.0–100.0)
Platelets: 352 10*3/uL (ref 150–400)
RBC: 4.94 MIL/uL (ref 3.87–5.11)
RDW: 14.6 % (ref 11.5–15.5)
WBC: 16.6 10*3/uL — ABNORMAL HIGH (ref 4.0–10.5)

## 2017-07-29 LAB — TYPE AND SCREEN
ABO/RH(D): O POS
ANTIBODY SCREEN: NEGATIVE

## 2017-07-29 LAB — HEPATITIS B SURFACE ANTIGEN: Hepatitis B Surface Ag: NEGATIVE

## 2017-07-29 SURGERY — Surgical Case
Anesthesia: Epidural

## 2017-07-29 MED ORDER — DIPHENHYDRAMINE HCL 50 MG/ML IJ SOLN
INTRAMUSCULAR | Status: AC
  Filled 2017-07-29: qty 1

## 2017-07-29 MED ORDER — MORPHINE SULFATE (PF) 0.5 MG/ML IJ SOLN
INTRAMUSCULAR | Status: DC | PRN
  Administered 2017-07-29: 4 mg via EPIDURAL

## 2017-07-29 MED ORDER — LACTATED RINGERS IV SOLN: INTRAVENOUS | Status: DC

## 2017-07-29 MED ORDER — IBUPROFEN 600 MG PO TABS
600.0000 mg | ORAL_TABLET | Freq: Four times a day (QID) | ORAL | Status: DC
  Administered 2017-07-29 – 2017-08-01 (×12): 600 mg via ORAL
  Filled 2017-07-29 (×13): qty 1

## 2017-07-29 MED ORDER — SIMETHICONE 80 MG PO CHEW
80.0000 mg | CHEWABLE_TABLET | Freq: Three times a day (TID) | ORAL | Status: DC
  Administered 2017-07-30 – 2017-08-01 (×7): 80 mg via ORAL
  Filled 2017-07-29 (×7): qty 1

## 2017-07-29 MED ORDER — PHENYLEPHRINE 40 MCG/ML (10ML) SYRINGE FOR IV PUSH (FOR BLOOD PRESSURE SUPPORT)
PREFILLED_SYRINGE | INTRAVENOUS | Status: AC
  Filled 2017-07-29: qty 10

## 2017-07-29 MED ORDER — COCONUT OIL OIL: 1.0000 "application " | TOPICAL_OIL | Status: DC | PRN

## 2017-07-29 MED ORDER — NALBUPHINE HCL 10 MG/ML IJ SOLN: 5.0000 mg | Freq: Once | INTRAMUSCULAR | Status: DC | PRN

## 2017-07-29 MED ORDER — PHENYLEPHRINE 40 MCG/ML (10ML) SYRINGE FOR IV PUSH (FOR BLOOD PRESSURE SUPPORT)
80.0000 ug | PREFILLED_SYRINGE | INTRAVENOUS | Status: DC | PRN
Start: 2017-07-29 — End: 2017-07-29
  Filled 2017-07-29: qty 10

## 2017-07-29 MED ORDER — LACTATED RINGERS IV SOLN
INTRAVENOUS | Status: DC
  Administered 2017-07-29: 19:00:00 via INTRAVENOUS

## 2017-07-29 MED ORDER — PROMETHAZINE HCL 25 MG/ML IJ SOLN: 6.2500 mg | INTRAMUSCULAR | Status: DC | PRN

## 2017-07-29 MED ORDER — SODIUM BICARBONATE 8.4 % IV SOLN
INTRAVENOUS | Status: DC | PRN
  Administered 2017-07-29 (×2): 5 mL via EPIDURAL

## 2017-07-29 MED ORDER — ZOLPIDEM TARTRATE 5 MG PO TABS: 5.0000 mg | ORAL_TABLET | Freq: Every evening | ORAL | Status: DC | PRN

## 2017-07-29 MED ORDER — LIDOCAINE HCL (PF) 1 % IJ SOLN: 30.0000 mL | INTRAMUSCULAR | Status: DC | PRN

## 2017-07-29 MED ORDER — SCOPOLAMINE 1 MG/3DAYS TD PT72
1.0000 | MEDICATED_PATCH | Freq: Once | TRANSDERMAL | Status: DC
  Filled 2017-07-29: qty 1

## 2017-07-29 MED ORDER — SCOPOLAMINE 1 MG/3DAYS TD PT72
MEDICATED_PATCH | TRANSDERMAL | Status: DC | PRN
  Administered 2017-07-29: 1 via TRANSDERMAL

## 2017-07-29 MED ORDER — PENICILLIN G POT IN DEXTROSE 60000 UNIT/ML IV SOLN
3.0000 10*6.[IU] | INTRAVENOUS | Status: DC
  Filled 2017-07-29: qty 50

## 2017-07-29 MED ORDER — DIBUCAINE 1 % RE OINT: 1.0000 "application " | TOPICAL_OINTMENT | RECTAL | Status: DC | PRN

## 2017-07-29 MED ORDER — NALOXONE HCL 0.4 MG/ML IJ SOLN: 0.4000 mg | INTRAMUSCULAR | Status: DC | PRN

## 2017-07-29 MED ORDER — DIPHENHYDRAMINE HCL 50 MG/ML IJ SOLN
12.5000 mg | INTRAMUSCULAR | Status: DC | PRN
  Administered 2017-07-29 – 2017-07-30 (×2): 12.5 mg via INTRAVENOUS
  Filled 2017-07-29: qty 1

## 2017-07-29 MED ORDER — FENTANYL 2.5 MCG/ML BUPIVACAINE 1/10 % EPIDURAL INFUSION (WH - ANES)
14.0000 mL/h | INTRAMUSCULAR | Status: DC | PRN
  Administered 2017-07-29: 14 mL/h via EPIDURAL
  Filled 2017-07-29: qty 100

## 2017-07-29 MED ORDER — TERBUTALINE SULFATE 1 MG/ML IJ SOLN
0.2500 mg | Freq: Once | INTRAMUSCULAR | Status: AC
  Administered 2017-07-29: 0.25 mg via SUBCUTANEOUS

## 2017-07-29 MED ORDER — MENTHOL 3 MG MT LOZG: 1.0000 | LOZENGE | OROMUCOSAL | Status: DC | PRN

## 2017-07-29 MED ORDER — LIDOCAINE HCL (PF) 1 % IJ SOLN
INTRAMUSCULAR | Status: DC | PRN
  Administered 2017-07-29 (×2): 4 mL

## 2017-07-29 MED ORDER — LACTATED RINGERS IV SOLN
INTRAVENOUS | Status: DC | PRN
  Administered 2017-07-29: 40 [IU] via INTRAVENOUS

## 2017-07-29 MED ORDER — SIMETHICONE 80 MG PO CHEW: 80.0000 mg | CHEWABLE_TABLET | ORAL | Status: DC | PRN

## 2017-07-29 MED ORDER — NALBUPHINE HCL 10 MG/ML IJ SOLN
5.0000 mg | INTRAMUSCULAR | Status: DC | PRN
Start: 2017-07-29 — End: 2017-08-01

## 2017-07-29 MED ORDER — OXYTOCIN 40 UNITS IN LACTATED RINGERS INFUSION - SIMPLE MED: 2.5000 [IU]/h | INTRAVENOUS | Status: DC

## 2017-07-29 MED ORDER — CEFAZOLIN SODIUM-DEXTROSE 2-3 GM-%(50ML) IV SOLR
INTRAVENOUS | Status: DC | PRN
  Administered 2017-07-29: 2 g via INTRAVENOUS

## 2017-07-29 MED ORDER — HYDROMORPHONE HCL 1 MG/ML IJ SOLN: 0.2500 mg | INTRAMUSCULAR | Status: DC | PRN

## 2017-07-29 MED ORDER — FLEET ENEMA 7-19 GM/118ML RE ENEM: 1.0000 | ENEMA | RECTAL | Status: DC | PRN

## 2017-07-29 MED ORDER — KETOROLAC TROMETHAMINE 30 MG/ML IJ SOLN
INTRAMUSCULAR | Status: AC
  Filled 2017-07-29: qty 1

## 2017-07-29 MED ORDER — EPHEDRINE 5 MG/ML INJ
10.0000 mg | INTRAVENOUS | Status: DC | PRN
Start: 2017-07-29 — End: 2017-07-29

## 2017-07-29 MED ORDER — WITCH HAZEL-GLYCERIN EX PADS: 1.0000 "application " | MEDICATED_PAD | CUTANEOUS | Status: DC | PRN

## 2017-07-29 MED ORDER — ONDANSETRON HCL 4 MG/2ML IJ SOLN: 4.0000 mg | Freq: Four times a day (QID) | INTRAMUSCULAR | Status: DC | PRN

## 2017-07-29 MED ORDER — LACTATED RINGERS IV SOLN: 500.0000 mL | Freq: Once | INTRAVENOUS | Status: DC

## 2017-07-29 MED ORDER — DIPHENHYDRAMINE HCL 25 MG PO CAPS: 25.0000 mg | ORAL_CAPSULE | Freq: Four times a day (QID) | ORAL | Status: DC | PRN

## 2017-07-29 MED ORDER — OXYTOCIN 10 UNIT/ML IJ SOLN
INTRAMUSCULAR | Status: AC
  Filled 2017-07-29: qty 4

## 2017-07-29 MED ORDER — ACETAMINOPHEN 325 MG PO TABS: 650.0000 mg | ORAL_TABLET | ORAL | Status: DC | PRN

## 2017-07-29 MED ORDER — FENTANYL 2.5 MCG/ML BUPIVACAINE 1/10 % EPIDURAL INFUSION (WH - ANES): 14.0000 mL/h | INTRAMUSCULAR | Status: DC | PRN

## 2017-07-29 MED ORDER — PRENATAL MULTIVITAMIN CH
1.0000 | ORAL_TABLET | Freq: Every day | ORAL | Status: DC
  Administered 2017-08-01: 1 via ORAL
  Filled 2017-07-29 (×3): qty 1

## 2017-07-29 MED ORDER — SENNOSIDES-DOCUSATE SODIUM 8.6-50 MG PO TABS
2.0000 | ORAL_TABLET | ORAL | Status: DC
  Administered 2017-07-29 – 2017-07-31 (×3): 2 via ORAL
  Filled 2017-07-29 (×3): qty 2

## 2017-07-29 MED ORDER — TETANUS-DIPHTH-ACELL PERTUSSIS 5-2.5-18.5 LF-MCG/0.5 IM SUSP: 0.5000 mL | Freq: Once | INTRAMUSCULAR | Status: DC

## 2017-07-29 MED ORDER — PENICILLIN V POTASSIUM 500 MG PO TABS: 500.0000 mg | ORAL_TABLET | Freq: Four times a day (QID) | ORAL | Status: DC

## 2017-07-29 MED ORDER — SIMETHICONE 80 MG PO CHEW
80.0000 mg | CHEWABLE_TABLET | ORAL | Status: DC
  Administered 2017-07-29 – 2017-07-31 (×3): 80 mg via ORAL
  Filled 2017-07-29 (×3): qty 1

## 2017-07-29 MED ORDER — DIPHENHYDRAMINE HCL 25 MG PO CAPS
25.0000 mg | ORAL_CAPSULE | ORAL | Status: DC | PRN
  Filled 2017-07-29 (×2): qty 1

## 2017-07-29 MED ORDER — FENTANYL CITRATE (PF) 100 MCG/2ML IJ SOLN
100.0000 ug | INTRAMUSCULAR | Status: DC | PRN
  Filled 2017-07-29: qty 2

## 2017-07-29 MED ORDER — OXYCODONE-ACETAMINOPHEN 5-325 MG PO TABS: 1.0000 | ORAL_TABLET | ORAL | Status: DC | PRN

## 2017-07-29 MED ORDER — MEPERIDINE HCL 25 MG/ML IJ SOLN: 6.2500 mg | INTRAMUSCULAR | Status: DC | PRN

## 2017-07-29 MED ORDER — TERBUTALINE SULFATE 1 MG/ML IJ SOLN
INTRAMUSCULAR | Status: AC
  Filled 2017-07-29: qty 1

## 2017-07-29 MED ORDER — LACTATED RINGERS IV SOLN
INTRAVENOUS | Status: DC | PRN
  Administered 2017-07-29 (×2): via INTRAVENOUS

## 2017-07-29 MED ORDER — OXYCODONE HCL 5 MG PO TABS
5.0000 mg | ORAL_TABLET | ORAL | Status: DC | PRN
  Administered 2017-07-30: 5 mg via ORAL
  Filled 2017-07-29: qty 1

## 2017-07-29 MED ORDER — KETOROLAC TROMETHAMINE 30 MG/ML IJ SOLN
30.0000 mg | Freq: Once | INTRAMUSCULAR | Status: DC | PRN
  Administered 2017-07-29: 30 mg via INTRAVENOUS

## 2017-07-29 MED ORDER — PENICILLIN G POTASSIUM 5000000 UNITS IJ SOLR
5.0000 10*6.[IU] | Freq: Once | INTRAMUSCULAR | Status: AC
  Administered 2017-07-29: 5 10*6.[IU] via INTRAVENOUS
  Filled 2017-07-29: qty 5

## 2017-07-29 MED ORDER — CEFAZOLIN SODIUM-DEXTROSE 2-3 GM-%(50ML) IV SOLR
INTRAVENOUS | Status: AC
  Filled 2017-07-29: qty 50

## 2017-07-29 MED ORDER — OXYCODONE HCL 5 MG PO TABS
10.0000 mg | ORAL_TABLET | ORAL | Status: DC | PRN
  Administered 2017-07-30 – 2017-07-31 (×3): 10 mg via ORAL
  Filled 2017-07-29 (×3): qty 2

## 2017-07-29 MED ORDER — LACTATED RINGERS IV SOLN
500.0000 mL | INTRAVENOUS | Status: DC | PRN
  Administered 2017-07-29: 500 mL via INTRAVENOUS
  Administered 2017-07-29: 1000 mL via INTRAVENOUS

## 2017-07-29 MED ORDER — PENICILLIN V POTASSIUM 500 MG PO TABS
500.0000 mg | ORAL_TABLET | Freq: Four times a day (QID) | ORAL | Status: DC
  Filled 2017-07-29 (×2): qty 1

## 2017-07-29 MED ORDER — OXYTOCIN 40 UNITS IN LACTATED RINGERS INFUSION - SIMPLE MED: 2.5000 [IU]/h | INTRAVENOUS | Status: AC

## 2017-07-29 MED ORDER — PHENYLEPHRINE 40 MCG/ML (10ML) SYRINGE FOR IV PUSH (FOR BLOOD PRESSURE SUPPORT)
PREFILLED_SYRINGE | INTRAVENOUS | Status: AC
  Filled 2017-07-29: qty 30

## 2017-07-29 MED ORDER — DIPHENHYDRAMINE HCL 50 MG/ML IJ SOLN: 12.5000 mg | INTRAMUSCULAR | Status: DC | PRN

## 2017-07-29 MED ORDER — SODIUM CHLORIDE 0.9 % IR SOLN
Status: DC | PRN
  Administered 2017-07-29: 400 mL

## 2017-07-29 MED ORDER — OXYCODONE-ACETAMINOPHEN 5-325 MG PO TABS: 2.0000 | ORAL_TABLET | ORAL | Status: DC | PRN

## 2017-07-29 MED ORDER — NALOXONE HCL 2 MG/2ML IJ SOSY
1.0000 ug/kg/h | PREFILLED_SYRINGE | INTRAVENOUS | Status: DC | PRN
  Filled 2017-07-29: qty 2

## 2017-07-29 MED ORDER — DEXAMETHASONE SODIUM PHOSPHATE 4 MG/ML IJ SOLN
INTRAMUSCULAR | Status: AC
  Filled 2017-07-29: qty 1

## 2017-07-29 MED ORDER — ONDANSETRON HCL 4 MG/2ML IJ SOLN
INTRAMUSCULAR | Status: AC
  Filled 2017-07-29: qty 2

## 2017-07-29 MED ORDER — PHENYLEPHRINE HCL 10 MG/ML IJ SOLN
INTRAMUSCULAR | Status: DC | PRN
  Administered 2017-07-29 (×3): 80 ug via INTRAVENOUS

## 2017-07-29 MED ORDER — PHENYLEPHRINE 40 MCG/ML (10ML) SYRINGE FOR IV PUSH (FOR BLOOD PRESSURE SUPPORT): 80.0000 ug | PREFILLED_SYRINGE | INTRAVENOUS | Status: DC | PRN

## 2017-07-29 MED ORDER — SODIUM CHLORIDE 0.9% FLUSH: 3.0000 mL | INTRAVENOUS | Status: DC | PRN

## 2017-07-29 MED ORDER — DEXAMETHASONE SODIUM PHOSPHATE 4 MG/ML IJ SOLN
INTRAMUSCULAR | Status: DC | PRN
  Administered 2017-07-29: 4 mg via INTRAVENOUS

## 2017-07-29 MED ORDER — ONDANSETRON HCL 4 MG/2ML IJ SOLN
INTRAMUSCULAR | Status: DC | PRN
  Administered 2017-07-29: 4 mg via INTRAVENOUS

## 2017-07-29 MED ORDER — SOD CITRATE-CITRIC ACID 500-334 MG/5ML PO SOLN
30.0000 mL | ORAL | Status: DC | PRN
  Administered 2017-07-29: 30 mL via ORAL
  Filled 2017-07-29: qty 15

## 2017-07-29 MED ORDER — NALBUPHINE HCL 10 MG/ML IJ SOLN: 5.0000 mg | INTRAMUSCULAR | Status: DC | PRN

## 2017-07-29 MED ORDER — OXYTOCIN BOLUS FROM INFUSION: 500.0000 mL | Freq: Once | INTRAVENOUS | Status: DC

## 2017-07-29 MED ORDER — EPHEDRINE 5 MG/ML INJ: 10.0000 mg | INTRAVENOUS | Status: DC | PRN

## 2017-07-29 MED ORDER — MORPHINE SULFATE (PF) 0.5 MG/ML IJ SOLN
INTRAMUSCULAR | Status: AC
  Filled 2017-07-29: qty 10

## 2017-07-29 MED ORDER — ONDANSETRON HCL 4 MG/2ML IJ SOLN: 4.0000 mg | Freq: Three times a day (TID) | INTRAMUSCULAR | Status: DC | PRN

## 2017-07-29 SURGICAL SUPPLY — 38 items
BENZOIN TINCTURE PRP APPL 2/3 (GAUZE/BANDAGES/DRESSINGS) ×3 IMPLANT
CHLORAPREP W/TINT 26ML (MISCELLANEOUS) ×3 IMPLANT
CLAMP CORD UMBIL (MISCELLANEOUS) IMPLANT
CLOSURE STERI-STRIP 1/2X4 (GAUZE/BANDAGES/DRESSINGS) ×1
CLOSURE WOUND 1/2 X4 (GAUZE/BANDAGES/DRESSINGS) ×1
CLOTH BEACON ORANGE TIMEOUT ST (SAFETY) ×3 IMPLANT
CLSR STERI-STRIP ANTIMIC 1/2X4 (GAUZE/BANDAGES/DRESSINGS) ×2 IMPLANT
DRSG OPSITE POSTOP 4X10 (GAUZE/BANDAGES/DRESSINGS) ×3 IMPLANT
ELECT REM PT RETURN 9FT ADLT (ELECTROSURGICAL) ×3
ELECTRODE REM PT RTRN 9FT ADLT (ELECTROSURGICAL) ×1 IMPLANT
EXTRACTOR VACUUM M CUP 4 TUBE (SUCTIONS) IMPLANT
EXTRACTOR VACUUM M CUP 4' TUBE (SUCTIONS)
GLOVE BIO SURGEON STRL SZ 6 (GLOVE) ×3 IMPLANT
GLOVE BIOGEL PI IND STRL 6.5 (GLOVE) ×1 IMPLANT
GLOVE BIOGEL PI IND STRL 7.0 (GLOVE) ×2 IMPLANT
GLOVE BIOGEL PI IND STRL 7.5 (GLOVE) ×2 IMPLANT
GLOVE BIOGEL PI INDICATOR 6.5 (GLOVE) ×2
GLOVE BIOGEL PI INDICATOR 7.0 (GLOVE) ×4
GLOVE BIOGEL PI INDICATOR 7.5 (GLOVE) ×4
GLOVE ECLIPSE 7.5 STRL STRAW (GLOVE) ×3 IMPLANT
GOWN STRL REUS W/TWL LRG LVL3 (GOWN DISPOSABLE) ×12 IMPLANT
KIT ABG SYR 3ML LUER SLIP (SYRINGE) ×3 IMPLANT
NEEDLE HYPO 25X5/8 SAFETYGLIDE (NEEDLE) ×3 IMPLANT
NS IRRIG 1000ML POUR BTL (IV SOLUTION) ×3 IMPLANT
PACK C SECTION WH (CUSTOM PROCEDURE TRAY) ×3 IMPLANT
PAD OB MATERNITY 4.3X12.25 (PERSONAL CARE ITEMS) ×3 IMPLANT
PENCIL SMOKE EVAC W/HOLSTER (ELECTROSURGICAL) ×3 IMPLANT
RETRACTOR WND ALEXIS 25 LRG (MISCELLANEOUS) ×1 IMPLANT
RTRCTR C-SECT PINK 25CM LRG (MISCELLANEOUS) ×3 IMPLANT
RTRCTR WOUND ALEXIS 25CM LRG (MISCELLANEOUS) ×3
STRIP CLOSURE SKIN 1/2X4 (GAUZE/BANDAGES/DRESSINGS) ×2 IMPLANT
SUT VIC AB 0 CT1 36 (SUTURE) ×18 IMPLANT
SUT VIC AB 2-0 CT1 (SUTURE) ×6 IMPLANT
SUT VIC AB 2-0 CT1 27 (SUTURE) ×2
SUT VIC AB 2-0 CT1 TAPERPNT 27 (SUTURE) ×1 IMPLANT
SUT VIC AB 4-0 KS 27 (SUTURE) ×3 IMPLANT
TOWEL OR 17X24 6PK STRL BLUE (TOWEL DISPOSABLE) ×3 IMPLANT
TRAY FOLEY BAG SILVER LF 14FR (SET/KITS/TRAYS/PACK) ×3 IMPLANT

## 2017-07-29 NOTE — Anesthesia Procedure Notes (Signed)
Epidural Patient location during procedure: OB  Staffing Anesthesiologist: Vinicius Brockman, MD Performed: anesthesiologist   Preanesthetic Checklist Completed: patient identified, pre-op evaluation, timeout performed, IV checked, risks and benefits discussed and monitors and equipment checked  Epidural Patient position: sitting Prep: site prepped and draped and DuraPrep Patient monitoring: heart rate, continuous pulse ox and blood pressure Approach: midline Location: L2-L3 Injection technique: LOR air and LOR saline  Needle:  Needle type: Tuohy  Needle gauge: 17 G Needle length: 9 cm Needle insertion depth: 7 cm Catheter type: closed end flexible Catheter size: 19 Gauge Catheter at skin depth: 12 cm Test dose: negative  Assessment Sensory level: T8 Events: blood not aspirated, injection not painful, no injection resistance, negative IV test and no paresthesia  Additional Notes Reason for block:procedure for pain     

## 2017-07-29 NOTE — H&P (Signed)
OBSTETRIC ADMISSION HISTORY AND PHYSICAL  Donna PoissonMonica Singh is a 21 y.o. female G1P0 with IUP at 7478w5d by LMP(10/24/16)  presenting for SOL . She reports her contractions started at 1700(07/29/17). She has had 3 visits to MAU between 07/28/17 evening and 07/29/17 morning in which last visit she was found to have cervical change and decels on fetal monitoring. Patient also reported having "weird sensation" of gums and lips since Monday in which she was given penicillin (she states she only took the medication for 2 days). She reports +FMs, No LOF, no VB, no blurry vision, headaches or peripheral edema, and RUQ pain.  She plans on  Breast feeding. She is undecided for birth control. [facility]   Dating: By --->  Estimated Date of Delivery: 07/31/17  Sono:  04/13/17  @24wd , CWD, normal anatomy, cephalic presentation,  717.78g, 50.1% EFW Persistent cardiac echogenic foci, may simply represent normal cardiac development. Normal facial morphology.   Prenatal History/Complications: Patient Active Problem List   Diagnosis Date Noted  . Indication for care in labor or delivery 07/29/2017  . Sickle cell trait (HCC) 07/29/2017  . Positive GBS test 07/29/2017  . Fetal heart rate deceleration, delivered, current hospitalization 07/29/2017  . Status post primary low transverse cesarean section 07/29/2017    Past Medical History: Past Medical History:  Diagnosis Date  . Medical history non-contributory     Past Surgical History: Past Surgical History:  Procedure Laterality Date  . NO PAST SURGERIES      Obstetrical History: OB History    Gravida Para Term Preterm AB Living   1             SAB TAB Ectopic Multiple Live Births           0      Social History: Social History   Socioeconomic History  . Marital status: Single    Spouse name: None  . Number of children: None  . Years of education: None  . Highest education level: None  Social Needs  . Financial  resource strain: None  . Food insecurity - worry: None  . Food insecurity - inability: None  . Transportation needs - medical: None  . Transportation needs - non-medical: None  Occupational History  . None  Tobacco Use  . Smoking status: Never Smoker  . Smokeless tobacco: Never Used  Substance and Sexual Activity  . Alcohol use: No  . Drug use: No  . Sexual activity: No  Other Topics Concern  . None  Social History Narrative  . None    Family History: History reviewed. No pertinent family history.  Allergies: No Known Allergies  Medications Prior to Admission  Medication Sig Dispense Refill Last Dose  . acetaminophen (TYLENOL) 325 MG tablet Take 2 tablets (650 mg total) every 8 (eight) hours as needed by mouth. 15 tablet 0 More than a month at Unknown time  . lidocaine (XYLOCAINE) 2 % solution Use as directed 20 mLs as needed in the mouth or throat for mouth pain. 100 mL 0 07/27/2017 at Unknown time  . penicillin v potassium (VEETID) 500 MG tablet Take 1 tablet (500 mg total) 4 (four) times daily for 5 days by mouth. 20 tablet 0 07/27/2017 at Unknown time  . Prenatal Vit-Fe Fumarate-FA (PRENATAL MULTIVITAMIN) TABS tablet Take 1 tablet by mouth daily.    07/28/2017 at Unknown time     Review of Systems   All systems reviewed and negative except as stated  in HPI  Blood pressure 103/63, pulse (!) 112, temperature 98.1 F (36.7 C), temperature source Oral, resp. rate 18, height 5\' 3"  (1.6 m), weight 210 lb (95.3 kg), last menstrual period 10/24/2016, SpO2 100 %. General appearance: alert, cooperative and appears stated age Lungs: clear to auscultation bilaterally Heart: regular rate and rhythm Abdomen: soft, non-tender; bowel sounds normal Pelvic:  Extremities: Homans sign is negative, no sign of DVT DTR's  Presentation: cephalic Fetal monitoringBaseline: 120 bpm, Variability: Fair (1-6 bpm) and Decelerations: Late Uterine activityIntensity: strong Dilation:  6 Effacement (%): 100 Station: -1, -2 Exam by:: J.Thornton, RN   Prenatal labs: ABO, Rh: --/--/O POS (11/14 0930) Antibody: NEG (11/14 0930) Rubella:  immune RPR: Non Reactive (04/06 0030)  HBsAg:   AS, Hbs trait HIV:   non reactive GBS: Positive (10/15 0000)  1 hr Glucola 124 Genetic screening  Quad neg Anatomy US  Cardiac echogenic foci  Prenatal Transfer Tool  Maternal Diabetes: No Genetic Screening: Normal Maternal Ultrasounds/Referrals: Normal Fetal Ultrasounds or other Referrals:  None Maternal Substance Abuse:  No Significant Maternal Medications:  None Significant Maternal Lab Results: Lab values include: Group B Strep positive PCN  Results for orders placed or performed during the hospital encounter of 07/29/17 (from the past 24 hour(s))  CBC   Collection Time: 07/29/17  9:30 AM  Result Value Ref Range   WBC 16.6 (H) 4.0 - 10.5 K/uL   RBC 4.94 3.87 - 5.11 MIL/uL   Hemoglobin 12.6 12.0 - 15.0 g/dL   HCT 47.836.5 29.536.0 - 62.146.0 %   MCV 73.9 (L) 78.0 - 100.0 fL   MCH 25.5 (L) 26.0 - 34.0 pg   MCHC 34.5 30.0 - 36.0 g/dL   RDW 30.814.6 65.711.5 - 84.615.5 %   Platelets 352 150 - 400 K/uL  Type and screen [facility]   Collection Time: 07/29/17  9:30 AM  Result Value Ref Range   ABO/RH(D) O POS    Antibody Screen NEG    Sample Expiration 08/01/2017     Patient Active Problem List   Diagnosis Date Noted  . Indication for care in labor or delivery 07/29/2017  . Sickle cell trait (HCC) 07/29/2017  . Positive GBS test 07/29/2017  . Fetal heart rate deceleration, delivered, current hospitalization 07/29/2017  . Status post primary low transverse cesarean section 07/29/2017    Assessment/Plan:  Donna Singh is a 21 y.o. G1P0 at 5682w5d here for SOL.   #Labor: admit to to birthing suites. Expectant management #Pain: epidural #FWB: Cat 2 #ID:  GBS pos-PCN #MOF: breast #MOC: undecided #Circ:  girl  Suella BroadKeriann S Minott, MD  07/29/2017, 12:39 PM  I confirm  that I have verified the information documented in the resident's note and that I have also personally reperformed the physical exam and all medical decision making activities.   Luna KitchensKathryn Kooistra CNM

## 2017-07-29 NOTE — Progress Notes (Signed)
Called to room for fetal deceration. Hr 80s/90s, Dr. Earlene Plateravis there managing. Persisted for about 5 minutes. Not receiving pitocin. 5-6 cm dilated. Resolved w/ repositioning. Had had one or two late decelerations prior. Thick meconium noted. fse placed. Shortly thereafter 2 min prolonged decel to 50s that resolved with repositioning. Decision made to proceed expeditiously to c/s. Fetal heart tones checked in OR suite just prior to patient prep and noted to be in 130s.  The risks of cesarean section were discussed with the patient including but were not limited to: bleeding which may require transfusion or reoperation; infection which may require antibiotics; injury to bowel, bladder, ureters or other surrounding organs; injury to the fetus; need for additional procedures including hysterectomy in the event of a life-threatening hemorrhage; placental abnormalities wth subsequent pregnancies, incisional problems, thromboembolic phenomenon and other postoperative/anesthesia complications.  Anesthesia and OR aware.  Preoperative prophylactic antibiotics and SCDs ordered on call to the OR.  To OR when ready.

## 2017-07-29 NOTE — Op Note (Signed)
Cesarean Section Operative Report  Donna PoissonMonica Singh  PROCEDURE DATE: 07/29/2017  PREOPERATIVE DIAGNOSES: Intrauterine pregnancy at 2525w5d weeks gestation; NRFHT  POSTOPERATIVE DIAGNOSES: The same  PROCEDURE: Primary Low Transverse Cesarean Section  SURGEON:   Surgeon(s) and Role:    * Haizel Gatchell, Wilfred CurtisNoah Bedford, MD - Primary    * Degele, Kandra NicolasJulie P, MD - Assisting - OB Fellow  ASSISTANT:  Nolene EbbsJulie Degele, MD - OB Fellow  INDICATIONS: Donna PoissonMonica Singh is a 21 y.o. G1P0 at 6725w5d here for cesarean section secondary to the indications listed under preoperative diagnoses; please see preoperative note for further details.  The risks of cesarean section were discussed with the patient including but were not limited to: bleeding which may require transfusion or reoperation; infection which may require antibiotics; injury to bowel, bladder, ureters or other surrounding organs; injury to the fetus; need for additional procedures including hysterectomy in the event of a life-threatening hemorrhage; placental abnormalities wth subsequent pregnancies, incisional problems, thromboembolic phenomenon and other postoperative/anesthesia complications.   The patient concurred with the proposed plan, giving informed written consent for the procedure.    FINDINGS:  Viable female infant in cephalic presentation.  Apgars 6 and 7.  Clear amniotic fluid.  Intact placenta, three vessel cord.  Normal uterus, fallopian tubes and ovaries bilaterally.  ANESTHESIA: Epidural INTRAVENOUS FLUIDS: 2500 ml ESTIMATED BLOOD LOSS: 487 mL URINE OUTPUT:  200 ml SPECIMENS: Placenta sent to pathology COMPLICATIONS: None immediate  PROCEDURE IN DETAIL:  The patient preoperatively received intravenous antibiotics and had sequential compression devices applied to her lower extremities.  She was then taken to the operating room where the epidural anesthesia was dosed up to surgical level and was found to be adequate. She was then placed in a dorsal supine  position with a leftward tilt, and prepped and draped in a sterile manner.  A foley catheter was placed into her bladder and attached to constant gravity.    After an adequate timeout was performed, a Pfannenstiel skin incision was made with scalpel and carried through to the underlying layer of fascia. The fascia was incised in the midline, and this incision was extended bilaterally using the Mayo scissors.  Kocher clamps were applied to the superior aspect of the fascial incision and the underlying rectus muscles were dissected off bluntly.  A similar process was carried out on the inferior aspect of the fascial incision. The rectus muscles were separated in the midline bluntly and the peritoneum was entered bluntly. Attention was turned to the lower uterine segment where a low transverse hysterotomy was made with a scalpel and extended bilaterally bluntly.  The infant was successfully delivered, the cord was clamped and cut after one minute, and the infant was handed over to the awaiting neonatology team. Uterine massage was then administered, and the placenta delivered intact with a three-vessel cord. The uterus was then cleared of clots and debris.  The hysterotomy was closed with 0 Vicryl in a running locked fashion, and an imbricating layer was also placed with 0 Vicryl.  Figure-of-eight 0 Vicryl serosal stitches were placed to help with hemostasis.  The pelvis was cleared of all clot and debris. Hemostasis was confirmed on all surfaces.  The peritoneum was closed with a Vicryl running stitch and the rectus muscles were reapproximated using 0 Vicryl interrupted stitches. The fascia was then closed using 0 Vicryl  in a running fashion. Two interupted figure 0 vicryl sutures were used to close small opening in fascia at the midline.  The subcutaneous layer was  irrigated.  The skin was closed with a 4-0 Vicryl subcuticular stitch.   The patient tolerated the procedure well. Sponge, lap, instrument and needle  counts were correct x 3.  She was taken to the recovery room in stable condition.   Disposition: PACU - hemodynamically stable.   Maternal Condition: stable    Signed: Frederik PearJulie P Degele, MD OB Fellow 07/29/2017 12:32 PM   Attestation of Attending Supervision of Provider:  Evaluation and management procedures were performed by this provider under my supervision and collaboration. I have reviewed the provider's note and chart, and I agree with the management and plan.   Shonna ChockNOAH Kevonta Phariss, MD Faculty Practice, [facility]

## 2017-07-29 NOTE — Transfer of Care (Signed)
Immediate Anesthesia Transfer of Care Note  Patient: Donna PoissonMonica Malstrom  Procedure(s) Performed: CESAREAN SECTION (N/A )  Patient Location: PACU  Anesthesia Type:Epidural  Level of Consciousness: awake, alert  and oriented  Airway & Oxygen Therapy: Patient Spontanous Breathing  Post-op Assessment: Report given to RN and Post -op Vital signs reviewed and stable  Post vital signs: Reviewed and stable HR 102, RR, SaO2 100%, BP 104/51    Last Vitals:  Vitals:   07/29/17 1026 07/29/17 1033  BP: 112/64 103/63  Pulse: (!) 113 (!) 112  Resp:  18  Temp:    SpO2: 100% 100%    Last Pain:  Vitals:   07/29/17 0957  TempSrc:   PainSc: 9          Complications: No apparent anesthesia complications

## 2017-07-29 NOTE — MAU Note (Signed)
PT HAS RETURNED  WITH  [facility]   [facility]

## 2017-07-29 NOTE — Discharge Instructions (Signed)

## 2017-07-29 NOTE — Progress Notes (Addendum)
Faculty Note  Called to room for decel, FHR in 90s x 5 min. Found to be 6/70/-2 with ruptured membranes. Resolved with repositioning and O2. Rviewed with patient that if FHR decreases again and does not recover, may recommend urgent c-section. She is agreeable if necessary for fetal well being. Also agreeable to blood transufuion.   Baldemar LenisK. Meryl Naythen Heikkila, M.D. Attending Obstetrician & Gynecologist, [facility] for [facility], [facility]

## 2017-07-29 NOTE — Anesthesia Postprocedure Evaluation (Signed)
Anesthesia Post Note  Patient: Donna PoissonMonica Sibal  Procedure(s) Performed: CESAREAN SECTION (N/A )     Patient location during evaluation: PACU Anesthesia Type: Epidural Level of consciousness: awake and alert Pain management: pain level controlled Vital Signs Assessment: post-procedure vital signs reviewed and stable Respiratory status: spontaneous breathing, nonlabored ventilation and respiratory function stable Cardiovascular status: stable Postop Assessment: no headache, no backache and epidural receding Anesthetic complications: no    Last Vitals:  Vitals:   07/29/17 1345 07/29/17 1400  BP: 116/63 112/77  Pulse: 94 85  Resp: (!) 26 15  Temp: 36.8 C   SpO2: 100% 99%    Last Pain:  Vitals:   07/29/17 1408  TempSrc:   PainSc: 3    Pain Goal: Patients Stated Pain Goal: 3 (07/29/17 1400)               [name]

## 2017-07-29 NOTE — Anesthesia Preprocedure Evaluation (Signed)
Anesthesia Evaluation  Patient identified by MRN, date of birth, ID band Patient awake    Reviewed: Allergy & Precautions, NPO status , Patient's Chart, lab work & pertinent test results  Airway Mallampati: II  TM Distance: >3 FB Neck ROM: Full    Dental no notable dental hx.    Pulmonary neg pulmonary ROS,    Pulmonary exam normal breath sounds clear to auscultation       Cardiovascular negative cardio ROS Normal cardiovascular exam Rhythm:Regular Rate:Normal     Neuro/Psych negative neurological ROS  negative psych ROS   GI/Hepatic negative GI ROS, Neg liver ROS,   Endo/Other  negative endocrine ROS  Renal/GU negative Renal ROS     Musculoskeletal negative musculoskeletal ROS (+)   Abdominal   Peds  Hematology negative hematology ROS (+)   Anesthesia Other Findings   Reproductive/Obstetrics (+) Pregnancy                             Anesthesia Physical Anesthesia Plan  ASA: II  Anesthesia Plan: Epidural   Post-op Pain Management:    Induction:   PONV Risk Score and Plan:   Airway Management Planned:   Additional Equipment:   Intra-op Plan:   Post-operative Plan:   Informed Consent: I have reviewed the patients History and Physical, chart, labs and discussed the procedure including the risks, benefits and alternatives for the proposed anesthesia with the patient or authorized representative who has indicated his/her understanding and acceptance.       Plan Discussed with:   Anesthesia Plan Comments:         Anesthesia Quick Evaluation  

## 2017-07-30 LAB — CBC
HEMATOCRIT: 32.6 % — AB (ref 36.0–46.0)
HEMOGLOBIN: 10.8 g/dL — AB (ref 12.0–15.0)
MCH: 25.9 pg — AB (ref 26.0–34.0)
MCHC: 33.1 g/dL (ref 30.0–36.0)
MCV: 78.2 fL (ref 78.0–100.0)
Platelets: 306 10*3/uL (ref 150–400)
RBC: 4.17 MIL/uL (ref 3.87–5.11)
RDW: 14.3 % (ref 11.5–15.5)
WBC: 16.9 10*3/uL — ABNORMAL HIGH (ref 4.0–10.5)

## 2017-07-30 LAB — RPR: RPR Ser Ql: NONREACTIVE

## 2017-07-30 LAB — RUBELLA SCREEN: RUBELLA: 16.2 {index} (ref >0.99)

## 2017-07-30 NOTE — Progress Notes (Signed)
POSTPARTUM PROGRESS NOTE  Post Partum Day 1  Subjective:  Donna Singh is a 21 y.o. G1P0 s/p pLTCS at 327w6d.  No acute events overnight.  Pt reports lightheadedness and dizziness with ambulating. She denies problems with voiding or po intake.  She denies nausea or vomiting.  Pain is well controlled.  She has had flatus. She has not had bowel movement.  Lochia Small.   Objective: Blood pressure (!) 100/49, pulse (!) 58, temperature 97.8 F (36.6 C), temperature source Oral, resp. rate 18, height 5\' 3"  (1.6 m), weight 95.3 kg (210 lb), last menstrual period 10/24/2016, SpO2 98 %.  Physical Exam:  General: alert, cooperative and no distress Chest: no respiratory distress Heart:regular rate, distal pulses intact Abdomen: soft, nontender,  Uterine Fundus: firm, appropriately tender DVT Evaluation: No calf swelling or tenderness Extremities: no edema   Recent Labs    07/29/17 0930 07/30/17 0701  HGB 12.6 10.8*  HCT 36.5 32.6*    Assessment/Plan: Donna PoissonMonica Tow is a 21 y.o. G1P0 s/p pLTCS at 6827w6d   PPD#1 - Doing well. Contraception: IUD Feeding: Breast Dispo: Plan for discharge tomorrow.   LOS: 1 day   Orva Riles EltilibMD 07/30/2017, 7:47 AM

## 2017-07-30 NOTE — Progress Notes (Signed)
Baby in NICU. Mom plans to breastfeed. Set-up with a DEBP & educated on pumping, frequency & cleaning.

## 2017-07-30 NOTE — Addendum Note (Signed)
Addendum  created 07/30/17 [id number] by Elgie CongoMalinova, Stratton Villwock H, CRNA   Charge Capture section accepted, Sign clinical note

## 2017-07-30 NOTE — Progress Notes (Signed)
No indication of foley catheter insertion in patient chart. Documenting that foley was d/c'd at 0145 with last adequate output of 800cc in approximately 3.5hours, 14 hours PP. Newman PiesWiggins, Brenae Lasecki T, RN

## 2017-07-30 NOTE — Anesthesia Postprocedure Evaluation (Signed)
Anesthesia Post Note  Patient: Hosie PoissonMonica Alfred  Procedure(s) Performed: CESAREAN SECTION (N/A )     Patient location during evaluation: Mother Baby Anesthesia Type: Epidural Level of consciousness: awake and alert and oriented Pain management: pain level controlled Vital Signs Assessment: post-procedure vital signs reviewed and stable Respiratory status: spontaneous breathing and nonlabored ventilation Cardiovascular status: stable Postop Assessment: no headache, no apparent nausea or vomiting, no backache, adequate PO intake, epidural receding and patient able to bend at knees Anesthetic complications: no    Last Vitals:  Vitals:   07/29/17 2226 07/30/17 0650  BP: 108/61 (!) 100/49  Pulse: (!) 56 (!) 58  Resp: 16 18  Temp: 36.4 C 36.6 C  SpO2: 97% 98%    Last Pain:  Vitals:   07/30/17 0650  TempSrc: Oral  PainSc: 0-No pain   Pain Goal: Patients Stated Pain Goal: 3 (07/29/17 1430)               Marlina Cataldi Hristova

## 2017-07-31 MED ORDER — OXYCODONE-ACETAMINOPHEN 5-325 MG PO TABS
2.0000 | ORAL_TABLET | Freq: Four times a day (QID) | ORAL | Status: DC | PRN
Start: 2017-07-31 — End: 2017-08-01
  Administered 2017-07-31 – 2017-08-01 (×2): 2 via ORAL
  Filled 2017-07-31 (×2): qty 2

## 2017-07-31 NOTE — Progress Notes (Signed)
POSTPARTUM PROGRESS NOTE  Post Partum Day 2  Subjective:  Donna Singh is a 21 y.o. G1P0 s/p pLTCS at 5663w5d for NRFHT after presenting in labor.  No acute events overnight. She denies problems with voiding or oral intake.  She denies nausea or vomiting.  Still reports significant incisional pain especially with movement.  She has had flatus. She has not had bowel movement.  Lochia Small.  She is in NICU seeing her infant.   Objective: Blood pressure 104/67, pulse 70, temperature 98.1 F (36.7 C), temperature source Oral, resp. rate 18, height 5\' 3"  (1.6 m), weight 210 lb (95.3 kg), last menstrual period 10/24/2016, SpO2 100 %.  Physical Exam:  General: alert, cooperative and no distress Chest: no respiratory distress Heart:regular rate, distal pulses intact Abdomen: soft, nontender,  Incision: Wet honeycomb dressing (just had shower), peeled off. Incision C/D/I. No erythema, no drainage.  Uterine Fundus: firm, appropriately tender DVT Evaluation: No calf swelling or tenderness Extremities: no edema   Recent Labs    07/29/17 0930 07/30/17 0701  HGB 12.6 10.8*  HCT 36.5 32.6*    Assessment/Plan: Donna Singh is a 21 y.o. G1P0 s/p pLTCS at 6063w5d   PPD#2 - Doing well. Contraception: IUD Analgesia: Ordered for Percocet as needed for severe pain; will take with Ibuprofen. Heating pad ordered. Continue OOB. Incision: Dressing change ordered, communicated with RN. Feeding: Breast Dispo: Plan for discharge tomorrow if remains stable.   LOS: 2 days   Jaynie CollinsUgonna Bernetha Anschutz, MD 07/31/2017, 9:53 AM

## 2017-07-31 NOTE — Lactation Note (Signed)
This note was copied from a baby's chart. Lactation Consultation Note  Patient Name: Donna Hosie PoissonMonica Nez WUJWJ'XToday's Date: 07/31/2017 Reason for consult: Initial assessment;NICU baby;Term   Initial consult with mom of 2951 hour old NICU infant. Mom is able to go to the NICU and BF infant for some feedings.   Mom is pumping some with DEBP. She reports she is barely getting gtts on the flange. Discussed supply and demand and milk coming to volume. Enc mom to pump 8-12 x in 24 hours with DEBP on Initiate setting. Enc mom to hand express post pumping, mom reports she has been shown how to hand express.   [facility] and is to call after she goes home to get a pump. Discussed WIC is not open on the weekend and that [facility], mom said [facility] not [facility]. Discussed manual pumps and using pumps in the NICU when visiting infant.   Providing Milk for your Baby in NICU Booklet given, Reviewed pumping and what to expect and breast milk storage for the NICU infant. Colostrum collection containers given, enc mom to take even small amounts to the NICU. Mom to ask for breast milk labels when visiting that NICU.   BF Resources handout and LC Brochure given, mom informed of IP/OP Services, BF Support Groups and LC Phone #. Mom reports she has no questions/concerns at this time. Enc mom to call for assistance as needed.      Maternal Data Formula Feeding for Exclusion: No Has patient been taught Hand Expression?: Yes Does the patient have breastfeeding experience prior to this delivery?: No  Feeding Feeding Type: Bottle Fed - Formula Length of feed: 15 min  LATCH Score                   Interventions    Lactation Tools Discussed/Used WIC Program: Yes Pump Review: Milk Storage Initiated by:: Reviewed and encouraged every 2-3 hours   Consult Status Consult Status: Follow-up Date: 08/01/17 Follow-up type:  In-patient    Silas FloodSharon S Lourene Hoston 07/31/2017, 2:43 PM

## 2017-08-01 MED ORDER — IBUPROFEN 600 MG PO TABS: 600.0000 mg | ORAL_TABLET | Freq: Four times a day (QID) | ORAL | 0 refills | Status: DC

## 2017-08-01 MED ORDER — OXYCODONE-ACETAMINOPHEN 5-325 MG PO TABS
2.0000 | ORAL_TABLET | Freq: Four times a day (QID) | ORAL | 0 refills | Status: DC | PRN
Start: 2017-08-01 — End: 2019-09-01

## 2017-08-01 NOTE — Discharge Instructions (Signed)

## 2017-08-01 NOTE — Discharge Summary (Signed)
OB Discharge Summary     Patient Name: Donna Singh DOB: 04/15/1996 MRN: [id number]  Date of admission: 07/29/2017 Delivering MD: Donna Singh   Date of discharge: 08/01/2017  Admitting diagnosis: 39.5 WEEKS CTX Intrauterine pregnancy: 4110w1d     Secondary diagnosis:  Principal Problem:   Status post primary low transverse cesarean section Active Problems:   Indication for care in labor or delivery   Sickle cell trait (HCC)   Positive GBS test   Fetal heart rate deceleration, delivered, current hospitalization  Additional problems: None     Discharge diagnosis: Term Pregnancy Delivered and Non reassuring Fetal Heart Tracings                                                                                                Post partum procedures:None  Augmentation: None  Complications: None  Hospital course:  Onset of Labor With Unplanned C/S  21 y.o. yo G1P0 at 6210w1d was admitted in Active Labor on 07/29/2017. Patient had a labor course significant for pLTCS for NRFHT. Membrane Rupture Time/Date: 10:04 AM ,07/29/2017   The patient went for cesarean section due to Non-Reassuring FHR, and delivered a Viable infant,07/29/2017  Details of operation can be found in separate operative note. Patient had an uncomplicated postpartum course.  She is ambulating,tolerating a regular diet, passing flatus, and urinating well.  Patient is discharged home in stable condition 08/01/17.  Physical exam  Vitals:   07/30/17 1840 07/31/17 0500 07/31/17 1949 08/01/17 0548  BP: 110/69 104/67 98/83 100/62  Pulse: 77 70 98 81  Resp: 18 18 18 18   Temp: 98.6 F (37 C) 98.1 F (36.7 C) 99.3 F (37.4 C) 98.4 F (36.9 C)  TempSrc: Oral Oral Oral Oral  SpO2:      Weight:      Height:       General: alert, cooperative and no distress Lochia: appropriate Uterine Fundus: firm Incision: Healing well with no significant drainage DVT Evaluation: No evidence of DVT seen on physical  exam. Labs: Lab Results  Component Value Date   WBC 16.9 (H) 07/30/2017   HGB 10.8 (L) 07/30/2017   HCT 32.6 (L) 07/30/2017   MCV 78.2 07/30/2017   PLT 306 07/30/2017   CMP Latest Ref Rng & Units 06/19/2017  Glucose 65 - 99 mg/dL 865(H120(H)  BUN 6 - 20 mg/dL 5(L)  Creatinine 8.460.44 - 1.00 mg/dL 9.620.66  Sodium 952135 - 841145 mmol/L 135  Potassium 3.5 - 5.1 mmol/L 3.3(L)  Chloride 101 - 111 mmol/L 104  CO2 22 - 32 mmol/L 22  Calcium 8.9 - 10.3 mg/dL 9.4    Discharge instruction: per After Visit Summary and "Baby and Me Booklet".  After visit meds:  Allergies as of 08/01/2017   No Known Allergies     Medication List    TAKE these medications   ibuprofen 600 MG tablet Commonly known as:  ADVIL,MOTRIN Take 1 tablet (600 mg total) every 6 (six) hours by mouth.   lidocaine 2 % solution Commonly known as:  XYLOCAINE Use as directed 20 mLs as needed in the mouth or  throat for mouth pain.   oxyCODONE-acetaminophen 5-325 MG tablet Commonly known as:  PERCOCET/ROXICET Take 2 tablets every 6 (six) hours as needed by mouth for moderate pain or severe pain.   prenatal multivitamin Tabs tablet Take 1 tablet by mouth daily.     ASK your doctor about these medications   penicillin v potassium 500 MG tablet Commonly known as:  VEETID Take 1 tablet (500 mg total) 4 (four) times daily for 5 days by mouth. Ask about: Should I take this medication?       Diet: routine diet  Activity: Advance as tolerated. Pelvic rest for 6 weeks.   Outpatient follow up:4 weeks Follow up Appt:No future appointments. Follow up Visit:No Follow-up on file.  Postpartum contraception: IUD unspecified  Newborn Data: Live born female  Birth Weight: 8 lb 14.9 oz (4050 g) APGAR: 6, 7  Newborn Delivery   Birth date/time:  07/29/2017 11:26:00 Delivery type:  C-Section, Low Transverse C-section categorization:  Primary     Baby Feeding: Breast Disposition:Baby currently in NICU, but progressing well will  likely be discharged today     08/01/2017 Donna NeighboursAbdoulaye Jaicey Sweaney, MD

## 2017-08-03 ENCOUNTER — Encounter: Payer: Self-pay | Admitting: Obstetrics and Gynecology

## 2017-08-03 NOTE — Addendum Note (Signed)
Addendum  created 08/03/17 0730 by Lewie LoronGermeroth, Josi Roediger, MD   Intraprocedure Staff edited

## 2017-10-23 ENCOUNTER — Encounter (HOSPITAL_COMMUNITY): Payer: Self-pay

## 2017-11-04 ENCOUNTER — Other Ambulatory Visit: Payer: Self-pay

## 2017-11-04 ENCOUNTER — Encounter (HOSPITAL_COMMUNITY): Payer: Self-pay | Admitting: Emergency Medicine

## 2017-11-04 ENCOUNTER — Ambulatory Visit (HOSPITAL_COMMUNITY)
Admission: EM | Admit: 2017-11-04 | Discharge: 2017-11-04 | Disposition: A | Discharge status: Home / Self Care | Payer: Medicaid Other | Attending: Family Medicine | Admitting: Family Medicine

## 2017-11-04 DIAGNOSIS — M25531 Pain in right wrist: Secondary | ICD-10-CM | POA: Diagnosis not present

## 2017-11-04 MED ORDER — IBUPROFEN 600 MG PO TABS: 600.0000 mg | ORAL_TABLET | Freq: Three times a day (TID) | ORAL | 0 refills | Status: AC

## 2017-11-04 NOTE — ED Triage Notes (Signed)
Pt c/o R hand pain and numbness for months but its gotten worse the last few days. Full ROm, no injury.

## 2017-11-04 NOTE — Discharge Instructions (Signed)
Symptoms could be due to inflammation of the tendons, carpal tunnel.  Start ibuprofen as directed.  Wear wrist splint.  Ice compress.  Avoid using the right hand to pick up the baby for now.  Follow-up with PCP/orthopedics for further evaluation if symptoms not improving.  Monitor for worsening of symptoms, complete numbness of the fingers, discoloration, unable to move the fingers, follow-up for reevaluation.

## 2017-11-04 NOTE — ED Provider Notes (Signed)
[facility]    CSN: [id number] Arrival date & time: 11/04/17  1120     History   Chief Complaint Chief Complaint  Patient presents with  . Hand Pain    HPI Donna Singh is a 22 y.o. female.   22 year old female comes in for 1 day worsening of  right wrist pain. Patient states that this has been going on for a few months, and has slowly worsened 3 months ago after her daughter was born.  She woke up this morning with numbness to her hand and came here for evaluation.  She has not taken anything for her symptoms so far.  Denies injury/trauma.  Able to move her wrist without problems.      Past Medical History:  Diagnosis Date  . Medical history non-contributory     Patient Active Problem List   Diagnosis Date Noted  . Indication for care in labor or delivery 07/29/2017  . Sickle cell trait (HCC) 07/29/2017  . Positive GBS test 07/29/2017  . Fetal heart rate deceleration, delivered, current hospitalization 07/29/2017  . Status post primary low transverse cesarean section 07/29/2017    Past Surgical History:  Procedure Laterality Date  . CESAREAN SECTION N/A 07/29/2017   Procedure: CESAREAN SECTION;  Surgeon: Kathrynn Running, MD;  Location: [facility]IRTH[facility] SUIT[facility];  Service: Obstetrics;  Laterality: N/A;    OB History    Gravida Para Term Preterm AB Living   1             SAB TAB Ectopic Multiple Live Births           0       Home Medications    Prior to Admission medications   Medication Sig Start Date End Date Taking? Authorizing Provider  Prenatal Vit-Fe Fumarate-FA (PRENATAL MULTIVITAMIN) TABS tablet Take 1 tablet by mouth daily.    Yes [provider]  ibuprofen (ADVIL,MOTRIN) 600 MG tablet Take 1 tablet (600 mg total) by mouth 3 (three) times daily for 10 days. 11/04/17 11/14/17  Cathie Hoops, Marsena Taff V, PA-C  lidocaine (XYLOCAINE) 2 % solution Use as directed 20 mLs as needed in the mouth or throat for mouth pain. Patient not taking: Reported on  11/04/2017 07/25/17   Caccavale, Sophia, PA-C  oxyCODONE-acetaminophen (PERCOCET/ROXICET) 5-325 MG tablet Take 2 tablets every 6 (six) hours as needed by mouth for moderate pain or severe pain. Patient not taking: Reported on 11/04/2017 08/01/17   Lovena Neighbours, MD    Family History History reviewed. No pertinent family history.  Social History Social History   Tobacco Use  . Smoking status: Never Smoker  . Smokeless tobacco: Never Used  Substance Use Topics  . Alcohol use: No  . Drug use: No     Allergies   Patient has no known allergies.   Review of Systems Review of Systems  Reason unable to perform ROS: See HPI as above.     Physical Exam Triage Vital Signs ED Triage Vitals  Enc Vitals Group     BP 11/04/17 1218 (!) 107/55     Pulse Rate 11/04/17 1218 60     Resp 11/04/17 1218 16     Temp 11/04/17 1218 98.2 F (36.8 C)     Temp src --      SpO2 11/04/17 1218 100 %     Weight --      Height --      Head Circumference --      Peak Flow --  Pain Score 11/04/17 1221 8     Pain Loc --      Pain Edu? --      Excl. in GC? --    No data found.  Updated Vital Signs BP (!) 107/55   Pulse 60   Temp 98.2 F (36.8 C)   Resp 16   LMP 10/26/2017   SpO2 100%   Physical Exam  Constitutional: She is oriented to person, place, and time. She appears well-developed and well-nourished. No distress.  HENT:  Head: Normocephalic and atraumatic.  Eyes: Conjunctivae are normal. Pupils are equal, round, and reactive to light.  Musculoskeletal:  No swelling, erythema, increased warmth, contusion seen.  Tenderness to palpation along the ulnar side of the wrist.  Full range of motion.  Grip strength decreased.  Sensation decreased of the right lateral fifth finger.  Radial pulse 2+ and equal bilaterally.  Cap refill less than 2 seconds.  Negative Tinel's, positive Phalen's. Negative Finkelstein's.  Neurological: She is alert and oriented to person, place, and time.      UC Treatments / Results  Labs (all labs ordered are listed, but only abnormal results are displayed) Labs Reviewed - No data to display  EKG  EKG Interpretation None       Radiology No results found.  Procedures Procedures (including critical care time)  Medications Ordered in UC Medications - No data to display   Initial Impression / Assessment and Plan / UC Course  I have reviewed the triage vital signs and the nursing notes.  Pertinent labs & imaging results that were available during my care of the patient were reviewed by me and considered in my medical decision making (see chart for details).    Will treat for inflammation and possible carpel tunnel. Given breastfeeding, start ibuprofen as directed. Wrist brace provided. Follow up with PCP/orthopedics for further evaluation and management needed. Return precautions given.   Final Clinical Impressions(s) / UC Diagnoses   Final diagnoses:  Right wrist pain    ED Discharge Orders        Ordered    ibuprofen (ADVIL,MOTRIN) 600 MG tablet  3 times daily     11/04/17 [address]1331        Georgette Helmer V, [place] JerseyPA-C 11/04/17 1601

## 2018-03-23 ENCOUNTER — Encounter (HOSPITAL_COMMUNITY): Payer: Self-pay

## 2018-03-23 ENCOUNTER — Emergency Department (HOSPITAL_COMMUNITY)
Admission: EM | Admit: 2018-03-23 | Discharge: 2018-03-23 | Disposition: A | Discharge status: Home / Self Care | Payer: Medicaid Other | Attending: Emergency Medicine | Admitting: Emergency Medicine

## 2018-03-23 ENCOUNTER — Emergency Department (HOSPITAL_COMMUNITY): Payer: Medicaid Other

## 2018-03-23 DIAGNOSIS — R079 Chest pain, unspecified: Secondary | ICD-10-CM | POA: Diagnosis present

## 2018-03-23 DIAGNOSIS — R0602 Shortness of breath: Secondary | ICD-10-CM | POA: Diagnosis not present

## 2018-03-23 DIAGNOSIS — R0789 Other chest pain: Secondary | ICD-10-CM | POA: Insufficient documentation

## 2018-03-23 DIAGNOSIS — R0781 Pleurodynia: Secondary | ICD-10-CM

## 2018-03-23 LAB — BASIC METABOLIC PANEL
Anion gap: 9 (ref 5–15)
BUN: 14 mg/dL (ref 6–20)
CO2: 22 mmol/L (ref 22–32)
Calcium: 9.8 mg/dL (ref 8.9–10.3)
Chloride: 106 mmol/L (ref 98–111)
Creatinine, Ser: 0.84 mg/dL (ref 0.44–1.00)
GFR calc Af Amer: >60 mL/min (ref >60)
GFR calc non Af Amer: >60 mL/min (ref >60)
Glucose, Bld: 97 mg/dL (ref 70–99)
Potassium: 3.6 mmol/L (ref 3.5–5.1)
Sodium: 137 mmol/L (ref 135–145)

## 2018-03-23 LAB — CBC WITH DIFFERENTIAL/PLATELET
Basophils Absolute: 0 10*3/uL (ref 0.0–0.1)
Basophils Relative: 0 %
Eosinophils Absolute: 0 10*3/uL (ref 0.0–0.7)
Eosinophils Relative: 0 %
HCT: 36.4 % (ref 36.0–46.0)
Hemoglobin: 12.2 g/dL (ref 12.0–15.0)
Lymphocytes Relative: 8 %
Lymphs Abs: 1.1 10*3/uL (ref 0.7–4.0)
MCH: 24.8 pg — ABNORMAL LOW (ref 26.0–34.0)
MCHC: 33.5 g/dL (ref 30.0–36.0)
MCV: 74 fL — ABNORMAL LOW (ref 78.0–100.0)
Monocytes Absolute: 0.9 10*3/uL (ref 0.1–1.0)
Monocytes Relative: 6 %
Neutro Abs: 12.8 10*3/uL — ABNORMAL HIGH (ref 1.7–7.7)
Neutrophils Relative %: 86 %
Platelets: 397 10*3/uL (ref 150–400)
RBC: 4.92 MIL/uL (ref 3.87–5.11)
RDW: 13.6 % (ref 11.5–15.5)
WBC: 14.9 10*3/uL — ABNORMAL HIGH (ref 4.0–10.5)

## 2018-03-23 LAB — I-STAT BETA HCG BLOOD, ED (MC, WL, AP ONLY): I-stat hCG, quantitative: <5 m[IU]/mL (ref <5)

## 2018-03-23 LAB — D-DIMER, QUANTITATIVE: D-Dimer, Quant: 0.67 ug/mL-FEU — ABNORMAL HIGH (ref 0.00–0.50)

## 2018-03-23 LAB — TROPONIN I: Troponin I: <0.03 ng/mL (ref <0.03)

## 2018-03-23 MED ORDER — LORAZEPAM 2 MG/ML IJ SOLN
0.5000 mg | Freq: Once | INTRAMUSCULAR | Status: AC
  Administered 2018-03-23: 0.5 mg via INTRAVENOUS
  Filled 2018-03-23: qty 1

## 2018-03-23 MED ORDER — IOPAMIDOL (ISOVUE-370) INJECTION 76%
100.0000 mL | Freq: Once | INTRAVENOUS | Status: AC | PRN
  Administered 2018-03-23: 100 mL via INTRAVENOUS

## 2018-03-23 MED ORDER — SODIUM CHLORIDE 0.9 % IV BOLUS
1000.0000 mL | Freq: Once | INTRAVENOUS | Status: AC
  Administered 2018-03-23: 1000 mL via INTRAVENOUS

## 2018-03-23 MED ORDER — IOPAMIDOL (ISOVUE-370) INJECTION 76%
INTRAVENOUS | Status: AC
  Filled 2018-03-23: qty 100

## 2018-03-23 MED ORDER — KETOROLAC TROMETHAMINE 15 MG/ML IJ SOLN
15.0000 mg | Freq: Once | INTRAMUSCULAR | Status: AC
  Administered 2018-03-23: 15 mg via INTRAVENOUS
  Filled 2018-03-23: qty 1

## 2018-03-23 NOTE — ED Notes (Signed)
Kohut,MD aware of patients temperature spike and is ok with patient going home.

## 2018-03-23 NOTE — ED Notes (Signed)
Patient transported to CT 

## 2018-03-23 NOTE — ED Triage Notes (Addendum)
Patient arrived via [facility] from home. Patient ambulatory. Patient c/o of dry cough for the last day. EMS states it is timed when anyone walks around her. Patient woke up this morning feeling shob. Lungs are cleared per ems. No hx. Of lung problems,No meds, no allergies. Patient only has pain when she coughs in her mid chest.12 lead normal per ems. Patient states her breathing issue started yesterday afternoon with pain and body aches. Patient reports waking up at 5 am with cough, shob, and pain in chest. Patient states she uses a humidifier at home.

## 2018-03-23 NOTE — ED Notes (Signed)
Bed: WA09 Expected date:  Expected time:  Means of arrival:  Comments: EMS 22 yo female cough,SOB since 0500

## 2018-03-28 NOTE — ED Provider Notes (Signed)
[facility]EMERGENCY DEPT Provider Note   CSN: [id number] Arrival date & time: 03/23/18  [id number]     History   Chief Complaint Chief Complaint  Patient presents with  . Cough  . Shortness of Breath    HPI Donna Singh is a 22 y.o. female.  HPI  22 year old female with chest pain.  Pain is sharp in the center of her chest.  She has had a dry cough for the past 2 days.  The past day she developed this sharp pain.  It is worse with coughing and deep breathing.  She does feel mildly short of breath.  No fevers or chills.  No unusual leg pain or swelling.  Past Medical History:  Diagnosis Date  . Medical history non-contributory     Patient Active Problem List   Diagnosis Date Noted  . Indication for care in labor or delivery 07/29/2017  . Sickle cell trait (HCC) 07/29/2017  . Positive GBS test 07/29/2017  . Fetal heart rate deceleration, delivered, current hospitalization 07/29/2017  . Status post primary low transverse cesarean section 07/29/2017    Past Surgical History:  Procedure Laterality Date  . CESAREAN SECTION N/A 07/29/2017   Procedure: CESAREAN SECTION;  Surgeon: Kathrynn RunningWouk, Noah Bedford, MD;  [facility];  Service: Obstetrics;  Laterality: N/A;     OB History    Gravida  1   Para      Term      Preterm      AB      Living        SAB      TAB      Ectopic      Multiple      Live Births  0            Home Medications    Prior to Admission medications   Medication Sig Start Date End Date Taking? Authorizing Provider  lidocaine (XYLOCAINE) 2 % solution Use as directed 20 mLs as needed in the mouth or throat for mouth pain. Patient not taking: Reported on 11/04/2017 07/25/17   Caccavale, Sophia, PA-C  oxyCODONE-acetaminophen (PERCOCET/ROXICET) 5-325 MG tablet Take 2 tablets every 6 (six) hours as needed by mouth for moderate pain or severe pain. Patient not taking: Reported on 11/04/2017 08/01/17   Lovena Neighboursiallo,  Abdoulaye, MD    Family History History reviewed. No pertinent family history.  Social History Social History   Tobacco Use  . Smoking status: Never Smoker  . Smokeless tobacco: Never Used  Substance Use Topics  . Alcohol use: No  . Drug use: No     Allergies   Patient has no known allergies.   Review of Systems Review of Systems  All systems reviewed and negative, other than as noted in HPI.  Physical Exam Updated Vital Signs BP 104/61   Pulse (!) 107   Temp 99.8 F (37.7 C) (Oral)   Resp (!) 22   Ht 5\' 3"  (1.6 m)   Wt 81.6 kg (180 lb)   LMP 02/24/2018 (Approximate)   SpO2 98%   BMI 31.89 kg/m   Physical Exam  Constitutional: She appears well-developed and well-nourished. No distress.  HENT:  Head: Normocephalic and atraumatic.  Eyes: Conjunctivae are normal. Right eye exhibits no discharge. Left eye exhibits no discharge.  Neck: Neck supple.  Cardiovascular: Regular rhythm and normal heart sounds. Exam reveals no gallop and no friction rub.  No murmur heard. Mild tachycardia.  No murmur or rub.  Pulmonary/Chest:  Breath sounds normal. No respiratory distress.  Mildly tachypneic.  No accessory muscle usage.  When she can speak in complete sentences.  No adventitious breath sounds appreciated.  Abdominal: Soft. She exhibits no distension. There is no tenderness.  Musculoskeletal: She exhibits no edema or tenderness.  Lower extremities symmetric as compared to each other. No calf tenderness. Negative Homan's. No palpable cords.   Neurological: She is alert.  Skin: Skin is warm and dry.  Psychiatric: She has a normal mood and affect. Her behavior is normal. Thought content normal.  Nursing note and vitals reviewed.    ED Treatments / Results  Labs (all labs ordered are listed, but only abnormal results are displayed) Labs Reviewed  CBC WITH DIFFERENTIAL/PLATELET - Abnormal; Notable for the following components:      Result Value   WBC 14.9 (*)    MCV  74.0 (*)    MCH 24.8 (*)    Neutro Abs 12.8 (*)    All other components within normal limits  D-DIMER, QUANTITATIVE (NOT AT [facility]) - Abnormal; Notable for the following components:   D-Dimer, Quant 0.67 (*)    All other components within normal limits  BASIC METABOLIC PANEL  TROPONIN I  I-STAT BETA HCG BLOOD, ED ([facility], [facility], [facility] ONLY)    EKG EKG Interpretation  Date/Time:  Tuesday March 23 2018 07:26:19 EDT Ventricular Rate:  96 PR Interval:    QRS Duration: 88 QT Interval:  360 QTC Calculation: 455 R Axis:   60 Text Interpretation:  Sinus rhythm Non-specific ST-t changes No old tracing to compare Confirmed by Raeford Razor [id number]) on 03/23/2018 9:14:49 AM Also confirmed by Raeford Razor [phone]), editor Sheppard Evens ([id number])  on 03/23/2018 9:16:44 AM   Radiology No results found.   Dg Chest 2 View  Result Date: 03/23/2018 CLINICAL DATA:  Cough.  Dyspnea.  Chest tightness. EXAM: CHEST - 2 VIEW COMPARISON:  03/24/2015 FINDINGS: The heart size and mediastinal contours are within normal limits. Both lungs are clear. Mild chronic thoracolumbar scoliosis. IMPRESSION: No acute abnormalities.  No change since the prior exam. Electronically Signed   By: Francene Boyers M.D.   On: 03/23/2018 08:09   Ct Angio Chest Pe W And/or Wo Contrast  Result Date: 03/23/2018 CLINICAL DATA:  Patient woke up this morning with cough, body aches and shortness of breath. EXAM: CT ANGIOGRAPHY CHEST WITH CONTRAST TECHNIQUE: Multidetector CT imaging of the chest was performed using the standard protocol during bolus administration of intravenous contrast. Multiplanar CT image reconstructions and MIPs were obtained to evaluate the vascular anatomy. CONTRAST:  ISOVUE-370 IOPAMIDOL (ISOVUE-370) INJECTION 76% COMPARISON:  None. FINDINGS: Cardiovascular: The heart is normal in size. No pericardial effusion. The aorta is normal in caliber. No dissection. Aberrant right subclavian artery noted incidentally. The pulmonary  arterial tree is well opacified. No filling defects to suggest pulmonary embolism. Mediastinum/Nodes: No mediastinal or hilar mass or lymphadenopathy. Residual thymic tissue noted in the anterior mediastinum, normal for age. The esophagus is grossly normal. Lungs/Pleura: The lungs are clear. No pleural effusion. No pulmonary lesions. Upper Abdomen: No significant findings. Musculoskeletal: No breast masses, supraclavicular or axillary adenopathy. The thyroid gland appears normal. No significant bony findings. Review of the MIP images confirms the above findings. IMPRESSION: 1. No CT findings for pulmonary embolism. 2. Normal thoracic aorta. Incidental absent right subclavian artery. 3. No acute pulmonary findings. Electronically Signed   By: Rudie Meyer M.D.   On: 03/23/2018 10:14    Procedures Procedures (including critical care  time)  Medications Ordered in ED Medications  sodium chloride 0.9 % bolus 1,000 mL (0 mLs Intravenous Stopped 03/23/18 0939)  ketorolac (TORADOL) 15 MG/ML injection 15 mg (15 mg Intravenous Given 03/23/18 0854)  LORazepam (ATIVAN) injection 0.5 mg (0.5 mg Intravenous Given 03/23/18 0854)  iopamidol (ISOVUE-370) 76 % injection 100 mL (100 mLs Intravenous Contrast Given 03/23/18 0946)     Initial Impression / Assessment and Plan / ED Course  I have reviewed the triage vital signs and the nursing notes.  Pertinent labs & imaging results that were available during my care of the patient were reviewed by me and considered in my medical decision making (see chart for details).     22 year old female with pleuritic chest pain.  It is atypical for ACS.  D-dimer was minimally elevated.  CTA without evidence of pulmonary embolism or pneumonia.  This may be a viral illness.  I doubt serious bacterial illness or other emergent process.  Plan rest, plenty of fluids and NSAIDs as needed.  Return precautions were discussed.  Final Clinical Impressions(s) / ED Diagnoses   Final  diagnoses:  Pleuritic chest pain    ED Discharge Orders    None       Raeford Razor, MD 03/28/18 1135

## 2018-08-14 ENCOUNTER — Emergency Department (HOSPITAL_COMMUNITY): Payer: Medicaid Other

## 2018-08-14 ENCOUNTER — Encounter (HOSPITAL_COMMUNITY): Payer: Self-pay | Admitting: Emergency Medicine

## 2018-08-14 ENCOUNTER — Other Ambulatory Visit: Payer: Self-pay

## 2018-08-14 ENCOUNTER — Emergency Department (HOSPITAL_COMMUNITY)
Admission: EM | Admit: 2018-08-14 | Discharge: 2018-08-14 | Disposition: A | Discharge status: Home / Self Care | Payer: Medicaid Other | Attending: Emergency Medicine | Admitting: Emergency Medicine

## 2018-08-14 DIAGNOSIS — X58XXXA Exposure to other specified factors, initial encounter: Secondary | ICD-10-CM | POA: Diagnosis not present

## 2018-08-14 DIAGNOSIS — Y929 Unspecified place or not applicable: Secondary | ICD-10-CM | POA: Insufficient documentation

## 2018-08-14 DIAGNOSIS — Y999 Unspecified external cause status: Secondary | ICD-10-CM | POA: Insufficient documentation

## 2018-08-14 DIAGNOSIS — Y939 Activity, unspecified: Secondary | ICD-10-CM | POA: Diagnosis not present

## 2018-08-14 DIAGNOSIS — M549 Dorsalgia, unspecified: Secondary | ICD-10-CM | POA: Diagnosis present

## 2018-08-14 DIAGNOSIS — S46812A Strain of other muscles, fascia and tendons at shoulder and upper arm level, left arm, initial encounter: Secondary | ICD-10-CM | POA: Diagnosis not present

## 2018-08-14 MED ORDER — METHOCARBAMOL 500 MG PO TABS
500.0000 mg | ORAL_TABLET | Freq: Once | ORAL | Status: AC
  Administered 2018-08-14: 500 mg via ORAL
  Filled 2018-08-14: qty 1

## 2018-08-14 MED ORDER — METHOCARBAMOL 500 MG PO TABS: 500.0000 mg | ORAL_TABLET | Freq: Two times a day (BID) | ORAL | 0 refills | Status: DC | PRN

## 2018-08-14 MED ORDER — NAPROXEN 500 MG PO TABS: 500.0000 mg | ORAL_TABLET | Freq: Two times a day (BID) | ORAL | 0 refills | Status: DC | PRN

## 2018-08-14 MED ORDER — NAPROXEN 250 MG PO TABS
500.0000 mg | ORAL_TABLET | Freq: Once | ORAL | Status: AC
  Administered 2018-08-14: 500 mg via ORAL
  Filled 2018-08-14: qty 2

## 2018-08-14 NOTE — Discharge Instructions (Signed)
Alternate ice and heat to areas of injury 3-4 times per day to limit inflammation and spasm.  Avoid strenuous activity and heavy lifting.  We recommend consistent use of naproxen in addition to Robaxin for muscle spasms. Do not drive or drink alcohol after taking Robaxin as it may make you drowsy and impair your judgment.  We recommend follow-up with a primary care doctor to ensure resolution of symptoms.  Return to the ED for any new or concerning symptoms. 

## 2018-08-14 NOTE — ED Triage Notes (Signed)
Pt reports left middle back pain that started while sleeping at 0000 this date. Pt reports that nothing makes this pain better.

## 2018-08-14 NOTE — ED Provider Notes (Signed)
[facility] EMERGENCY DEPARTMENT Provider Note   CSN: [id number] Arrival date & time: 08/14/18  0256     History   Chief Complaint Chief Complaint  Patient presents with  . Back Pain    Left; middle  back pain    HPI Donna Singh is a 22 y.o. female.   22 year old female presents to the emergency department for evaluation of back pain.  She states that pain is been extending from the left side of her neck down to her left mid back.  This started at midnight tonight and has been constant, worsening.  She denies taking any medications for her symptoms.  Notes associated paresthesias in her left arm.  No fall, trauma, injury.  Does work as a Lawyer doing heavy lifting.  Denies fever, bowel or bladder incontinence, inability to ambulate.     Past Medical History:  Diagnosis Date  . Medical history non-contributory     Patient Active Problem List   Diagnosis Date Noted  . Indication for care in labor or delivery 07/29/2017  . Sickle cell trait (HCC) 07/29/2017  . Positive GBS test 07/29/2017  . Fetal heart rate deceleration, delivered, current hospitalization 07/29/2017  . Status post primary low transverse cesarean section 07/29/2017    Past Surgical History:  Procedure Laterality Date  . CESAREAN SECTION N/A 07/29/2017   Procedure: CESAREAN SECTION;  Surgeon: Kathrynn Running, MD;  Location: [facility] BIRTHING SUITES;  Service: Obstetrics;  Laterality: N/A;     OB History    Gravida  1   Para      Term      Preterm      AB      Living        SAB      TAB      Ectopic      Multiple      Live Births  0            Home Medications    Prior to Admission medications   Medication Sig Start Date End Date Taking? Authorizing Provider  lidocaine (XYLOCAINE) 2 % solution Use as directed 20 mLs as needed in the mouth or throat for mouth pain. Patient not taking: Reported on 11/04/2017 07/25/17   Caccavale, Sophia, PA-C  methocarbamol  (ROBAXIN) 500 MG tablet Take 1 tablet (500 mg total) by mouth every 12 (twelve) hours as needed for muscle spasms. 08/14/18   Antony Madura, PA-C  naproxen (NAPROSYN) 500 MG tablet Take 1 tablet (500 mg total) by mouth every 12 (twelve) hours as needed for mild pain or moderate pain. 08/14/18   Antony Madura, PA-C  oxyCODONE-acetaminophen (PERCOCET/ROXICET) 5-325 MG tablet Take 2 tablets every 6 (six) hours as needed by mouth for moderate pain or severe pain. Patient not taking: Reported on 11/04/2017 08/01/17   Lovena Neighbours, MD    Family History No family history on file.  Social History Social History   Tobacco Use  . Smoking status: Never Smoker  . Smokeless tobacco: Never Used  Substance Use Topics  . Alcohol use: No  . Drug use: No     Allergies   Patient has no known allergies.   Review of Systems Review of Systems Ten systems reviewed and are negative for acute change, except as noted in the HPI.    Physical Exam Updated Vital Signs BP 109/67 (BP Location: Right Arm)   Pulse 63   Temp 98.9 F (37.2 C) (Oral)   Resp 18  Ht 5\' 3"  (1.6 m)   Wt 78 kg   LMP 08/10/2018 (Exact Date)   SpO2 100%   BMI 30.47 kg/m   Physical Exam  Constitutional: She is oriented to person, place, and time. She appears well-developed and well-nourished. No distress.  Nontoxic appearing and in NAD  HENT:  Head: Normocephalic and atraumatic.  Eyes: Conjunctivae and EOM are normal. No scleral icterus.  Neck: Normal range of motion.  TTP along the left trapezius muscle.  No bony deformities, step-offs, crepitus to the cervical midline.  Cardiovascular: Normal rate, regular rhythm and intact distal pulses.  Pulmonary/Chest: Effort normal. No stridor. No respiratory distress.  Respirations even and unlabored  Musculoskeletal: Normal range of motion.  Neurological: She is alert and oriented to person, place, and time. She exhibits normal muscle tone. Coordination normal.  GCS 15.  Speech is goal oriented. Patient has equal grip strength bilaterally with 5/5 strength against resistance in all major muscle groups bilaterally. Sensation to light touch intact. Patient moves extremities without ataxia.  Skin: Skin is warm and dry. No rash noted. She is not diaphoretic. No erythema. No pallor.  Psychiatric: She has a normal mood and affect. Her behavior is normal.  Nursing note and vitals reviewed.    ED Treatments / Results  Labs (all labs ordered are listed, but only abnormal results are displayed) Labs Reviewed - No data to display  EKG None  Radiology Dg Chest 2 View  Result Date: 08/14/2018 CLINICAL DATA:  Pleuritic pain. EXAM: CHEST - 2 VIEW COMPARISON:  Radiographs and CT 05/24/2018 FINDINGS: Right head tilt, patient unable to straighten neck.The cardiomediastinal contours are normal. The lungs are clear. Pulmonary vasculature is normal. No consolidation, pleural effusion, or pneumothorax. No acute osseous abnormalities are seen. IMPRESSION: No acute pulmonary process. Electronically Signed   By: Narda Rutherford M.D.   On: 08/14/2018 04:38    Procedures Procedures (including critical care time)  Medications Ordered in ED Medications  naproxen (NAPROSYN) tablet 500 mg (500 mg Oral Given 08/14/18 0411)  methocarbamol (ROBAXIN) tablet 500 mg (500 mg Oral Given 08/14/18 0411)     Initial Impression / Assessment and Plan / ED Course  I have reviewed the triage vital signs and the nursing notes.  Pertinent labs & imaging results that were available during my care of the patient were reviewed by me and considered in my medical decision making (see chart for details).     Patient presenting for back pain consistent with muscle strain of the left trapezius.  Pain is reproducible on palpation.  She is neurovascularly intact on exam.  No history of trauma or fall.  She denies bowel or bladder incontinence.  No red flags or signs concerning for cauda equina.   Notes discomfort in her chest as well.  She had a chest x-ray today which is unremarkable; negative for acute cardiopulmonary process.  Plan for outpatient management of muscle strain with naproxen and Robaxin.  Return precautions discussed and provided. Patient discharged in stable condition with no unaddressed concerns.   Final Clinical Impressions(s) / ED Diagnoses   Final diagnoses:  Trapezius muscle strain, left, initial encounter    ED Discharge Orders         Ordered    methocarbamol (ROBAXIN) 500 MG tablet  Every 12 hours PRN     08/14/18 0447    naproxen (NAPROSYN) 500 MG tablet  Every 12 hours PRN     08/14/18 0447  Antony MaduraHumes, Aslynn Brunetti, PA-C 08/14/18 [id number]    Shaune PollackIsaacs, Cameron, MD 08/14/18 [phone]

## 2019-03-21 ENCOUNTER — Encounter: Payer: Self-pay | Admitting: Physician Assistant

## 2019-03-21 ENCOUNTER — Other Ambulatory Visit (INDEPENDENT_AMBULATORY_CARE_PROVIDER_SITE_OTHER): Payer: Self-pay

## 2019-03-21 ENCOUNTER — Telehealth: Payer: Medicaid Other | Admitting: Physician Assistant

## 2019-03-21 DIAGNOSIS — M545 Low back pain, unspecified: Secondary | ICD-10-CM

## 2019-03-21 MED ORDER — NAPROXEN 500 MG PO TABS: 500.0000 mg | ORAL_TABLET | Freq: Two times a day (BID) | ORAL | 0 refills | Status: DC

## 2019-03-21 MED ORDER — CYCLOBENZAPRINE HCL 10 MG PO TABS: 10.0000 mg | ORAL_TABLET | Freq: Three times a day (TID) | ORAL | 0 refills | Status: DC | PRN

## 2019-03-21 NOTE — Progress Notes (Signed)

## 2019-08-20 ENCOUNTER — Telehealth: Payer: Medicaid Other | Admitting: Family

## 2019-08-20 DIAGNOSIS — J028 Acute pharyngitis due to other specified organisms: Secondary | ICD-10-CM | POA: Diagnosis not present

## 2019-08-20 DIAGNOSIS — J069 Acute upper respiratory infection, unspecified: Secondary | ICD-10-CM

## 2019-08-20 DIAGNOSIS — B9689 Other specified bacterial agents as the cause of diseases classified elsewhere: Secondary | ICD-10-CM | POA: Diagnosis not present

## 2019-08-20 MED ORDER — AMOXICILLIN 500 MG PO CAPS: 500.0000 mg | ORAL_CAPSULE | Freq: Two times a day (BID) | ORAL | 0 refills | Status: DC

## 2019-08-20 NOTE — Progress Notes (Signed)
We are sorry that you are not feeling well.  Here is how we plan to help!  Based on what you have shared with me it is likely that you have strep pharyngitis and an upper respiratory infection.  Strep pharyngitis is inflammation and infection in the back of the throat.  This is an infection cause by bacteria and is treated with antibiotics.  I have prescribed Amoxicillin 500 mg twice a day for 10 days. For throat pain, we recommend over the counter oral pain relief medications such as acetaminophen or aspirin, or anti-inflammatory medications such as ibuprofen or naproxen sodium. Topical treatments such as oral throat lozenges or sprays may be used as needed. Strep infections are not as easily transmitted as other respiratory infections, however we still recommend that you avoid close contact with loved ones, especially the very young and elderly.  Remember to wash your hands thoroughly throughout the day as this is the number one way to prevent the spread of infection and wipe down door knobs and counters with disinfectant.   Home Care:  Only take medications as instructed by your medical team.  Complete the entire course of an antibiotic.  Do not take these medications with alcohol.  A steam or ultrasonic humidifier can help congestion.  You can place a towel over your head and breathe in the steam from hot water coming from a faucet.  Avoid close contacts especially the very young and the elderly.  Cover your mouth when you cough or sneeze.  Always remember to wash your hands.  Get Help Right Away If:  You develop worsening fever or sinus pain.  You develop a severe head ache or visual changes.  Your symptoms persist after you have completed your treatment plan.  Make sure you  Understand these instructions.  Will watch your condition.  Will get help right away if you are not doing well or get worse.  Your e-visit answers were reviewed by a board certified advanced clinical  practitioner to complete your personal care plan.  Depending on the condition, your plan could have included both over the counter or prescription medications.  If there is a problem please reply  once you have received a response from your provider.  Your safety is important to [place].  If you have drug allergies check your prescription carefully.    You can use MyChart to ask questions about today's visit, request a non-urgent call back, or ask for a work or school excuse for 24 hours related to this e-Visit. If it has been greater than 24 hours you will need to follow up with your provider, or enter a new e-Visit to address those concerns.  You will get an e-mail in the next two days asking about your experience.  I hope that your e-visit has been valuable and will speed your recovery. Thank you for using e-visits.  Greater than 5 minutes, yet less than 10 minutes of time have been spent researching, coordinating, and implementing care for this patient today.  Thank you for the details you included in the comment boxes. Those details are very helpful in determining the best course of treatment for you and help [place] to provide the best care.

## 2019-09-01 ENCOUNTER — Other Ambulatory Visit: Payer: Self-pay

## 2019-09-01 ENCOUNTER — Ambulatory Visit (HOSPITAL_COMMUNITY)
Admission: EM | Admit: 2019-09-01 | Discharge: 2019-09-01 | Disposition: A | Discharge status: Home / Self Care | Payer: Medicaid Other | Attending: Internal Medicine | Admitting: Internal Medicine

## 2019-09-01 ENCOUNTER — Encounter (HOSPITAL_COMMUNITY): Payer: Self-pay | Admitting: Emergency Medicine

## 2019-09-01 DIAGNOSIS — J029 Acute pharyngitis, unspecified: Secondary | ICD-10-CM | POA: Diagnosis present

## 2019-09-01 MED ORDER — CETIRIZINE HCL 10 MG PO TABS: 10.0000 mg | ORAL_TABLET | Freq: Every day | ORAL | 0 refills | Status: DC

## 2019-09-01 NOTE — ED Triage Notes (Signed)
Pt here for persistent cold sx onset 2 weeks associated w/sore throat, headache, right ear pain, chills  Reports e-Visit on 12/6 and was Rx antibiotics w/no refills.   A&O x4... NAD.Marland Kitchen. ambulatory

## 2019-09-01 NOTE — ED Provider Notes (Addendum)
Donna Singh    CSN: [id number] Arrival date & time: 09/01/19  1611      History   Chief Complaint Chief Complaint  Patient presents with  . URI    HPI Donna Singh is a 23 y.o. female with no past medical history comes to urgent care with complaint of sore throat which has been persistent over the past 2 weeks at the minimum.  She admits to having some right ear pain, chills as well as headaches.  No sick contacts.  No nausea or vomiting.  No diarrhea.  She denies any generalized body aches.  Patient was recently treated with antibiotics for sore throat.  She has tried warm salt water gargles with no improvement in her symptoms.  She has been concerned about tonsillar stone and has been brushing over her tonsils.  HPI  Past Medical History:  Diagnosis Date  . Medical history non-contributory     Patient Active Problem List   Diagnosis Date Noted  . Indication for care in labor or delivery 07/29/2017  . Sickle cell trait ([place]) 07/29/2017  . Positive GBS test 07/29/2017  . Fetal heart rate deceleration, delivered, current hospitalization 07/29/2017  . Status post primary low transverse cesarean section 07/29/2017    Past Surgical History:  Procedure Laterality Date  . CESAREAN SECTION N/A 07/29/2017   Procedure: CESAREAN SECTION;  Surgeon: Gwynne Edinger, MD;  Location: [place];  Service: Obstetrics;  Laterality: N/A;    OB History    Gravida  1   Para      Term      Preterm      AB      Living        SAB      TAB      Ectopic      Multiple      Live Births  0            Home Medications    Prior to Admission medications   Medication Sig Start Date End Date Taking? Authorizing Provider  cetirizine (ZYRTEC ALLERGY) 10 MG tablet Take 1 tablet (10 mg total) by mouth daily. 09/01/19   Zayvien Canning, Myrene Galas, MD  cyclobenzaprine (FLEXERIL) 10 MG tablet Take 1 tablet (10 mg total) by mouth 3 (three) times daily as needed for  muscle spasms. 03/21/19   Waldon Merl, PA-C  methocarbamol (ROBAXIN) 500 MG tablet Take 1 tablet (500 mg total) by mouth every 12 (twelve) hours as needed for muscle spasms. 08/14/18   Antonietta Breach, PA-C  naproxen (NAPROSYN) 500 MG tablet Take 1 tablet (500 mg total) by mouth every 12 (twelve) hours as needed for mild pain or moderate pain. 08/14/18   Antonietta Breach, PA-C  naproxen (NAPROSYN) 500 MG tablet Take 1 tablet (500 mg total) by mouth 2 (two) times daily with a meal. 03/21/19   Waldon Merl, PA-C    Family History History reviewed. No pertinent family history.  Social History Social History   Tobacco Use  . Smoking status: Never Smoker  . Smokeless tobacco: Never Used  Substance Use Topics  . Alcohol use: No  . Drug use: No     Allergies   Patient has no known allergies.   Review of Systems Review of Systems  Constitutional: Negative for activity change, chills, fever and unexpected weight change.  HENT: Positive for ear pain and sore throat. Negative for congestion, postnasal drip and rhinorrhea.   Eyes: Negative.   Respiratory: Negative  for cough, shortness of breath and wheezing.   Cardiovascular: Negative for chest pain and palpitations.  Gastrointestinal: Negative.   Genitourinary: Negative.   Musculoskeletal: Negative for arthralgias, joint swelling and myalgias.  Neurological: Negative for dizziness, light-headedness and headaches.     Physical Exam Triage Vital Signs ED Triage Vitals  Enc Vitals Group     BP 09/01/19 1632 116/68     Pulse Rate 09/01/19 1632 72     Resp 09/01/19 1632 14     Temp 09/01/19 1632 99 F (37.2 C)     Temp Source 09/01/19 1632 Oral     SpO2 09/01/19 1632 100 %     Weight --      Height --      Head Circumference --      Peak Flow --      Pain Score 09/01/19 1629 7     Pain Loc --      Pain Edu? --      Excl. in GC? --    No data found.  Updated Vital Signs BP 116/68 (BP Location: Left Arm)   Pulse 72   Temp 99  F (37.2 C) (Oral)   Resp 14   LMP 08/15/2019   SpO2 100%   Breastfeeding No   Visual Acuity Right Eye Distance:   Left Eye Distance:   Bilateral Distance:    Right Eye Near:   Left Eye Near:    Bilateral Near:     Physical Exam Constitutional:      General: She is not in acute distress.    Appearance: She is not ill-appearing or toxic-appearing.  HENT:     Right Ear: Tympanic membrane normal. There is no impacted cerumen.     Left Ear: Tympanic membrane normal. There is no impacted cerumen.     Nose: Nose normal. No congestion or rhinorrhea.     Mouth/Throat:     Mouth: Mucous membranes are moist.     Comments: Mild tonsillar enlargement.  Erythema over the tonsils bilaterally.  No exudate. Eyes:     Extraocular Movements: Extraocular movements intact.     Conjunctiva/sclera: Conjunctivae normal.  Cardiovascular:     Rate and Rhythm: Normal rate and regular rhythm.     Pulses: Normal pulses.     Heart sounds: Normal heart sounds.  Pulmonary:     Effort: Pulmonary effort is normal. No respiratory distress.     Breath sounds: Normal breath sounds. No rhonchi or rales.  Musculoskeletal:        General: Normal range of motion.     Cervical back: Normal range of motion and neck supple. No tenderness.  Lymphadenopathy:     Cervical: No cervical adenopathy.  Neurological:     Mental Status: She is alert.      UC Treatments / Results  Labs (all labs ordered are listed, but only abnormal results are displayed) Labs Reviewed  CULTURE, GROUP A STREP [facility])    EKG   Radiology No results found.  Procedures Procedures (including critical care time)  Medications Ordered in UC Medications - No data to display  Initial Impression / Assessment and Plan / UC Course  I have reviewed the triage vital signs and the nursing notes.  Pertinent labs & imaging results that were available during my care of the patient were reviewed by me and considered in my medical  decision making (see chart for details).     1.  Sore throat: Rapid throat is negative Throat  cultures have been sent Patient is encouraged not to brush over the tonsils. I discouraged the patient from brushing her tonsils were grossly in situ of tonsillar stones.  I further explained to her that most of the tonsillar stones dislodge spontaneously and does not typically cause any discomfort or concerns.  Patient verbalized understanding. She will continue to use salt water gargles. Start Zyrtec 10 mg orally daily. Final Clinical Impressions(s) / UC Diagnoses   Final diagnoses:  Sorethroat   Discharge Instructions   None    ED Prescriptions    Medication Sig Dispense Auth. Provider   cetirizine (ZYRTEC ALLERGY) 10 MG tablet Take 1 tablet (10 mg total) by mouth daily. 30 tablet Kania Regnier, Britta Mccreedy, MD     PDMP not reviewed this encounter.   Merrilee Jansky, MD 09/01/19 Nira Retort, MD 09/01/19 Ernestina Columbia

## 2019-09-04 LAB — CULTURE, GROUP A STREP (THRC)

## 2019-10-17 ENCOUNTER — Encounter (HOSPITAL_COMMUNITY): Payer: Self-pay | Admitting: Emergency Medicine

## 2019-10-17 ENCOUNTER — Other Ambulatory Visit: Payer: Self-pay

## 2019-10-17 ENCOUNTER — Emergency Department (HOSPITAL_COMMUNITY)
Admission: EM | Admit: 2019-10-17 | Discharge: 2019-10-18 | Disposition: A | Discharge status: Home / Self Care | Payer: Medicaid Other | Attending: Emergency Medicine | Admitting: Emergency Medicine

## 2019-10-17 DIAGNOSIS — O26891 Other specified pregnancy related conditions, first trimester: Secondary | ICD-10-CM | POA: Insufficient documentation

## 2019-10-17 DIAGNOSIS — Z3A01 Less than 8 weeks gestation of pregnancy: Secondary | ICD-10-CM | POA: Insufficient documentation

## 2019-10-17 DIAGNOSIS — R1030 Lower abdominal pain, unspecified: Secondary | ICD-10-CM | POA: Diagnosis not present

## 2019-10-17 DIAGNOSIS — Z3490 Encounter for supervision of normal pregnancy, unspecified, unspecified trimester: Secondary | ICD-10-CM

## 2019-10-17 DIAGNOSIS — R112 Nausea with vomiting, unspecified: Secondary | ICD-10-CM | POA: Diagnosis not present

## 2019-10-17 LAB — I-STAT BETA HCG BLOOD, ED (MC, WL, AP ONLY): I-stat hCG, quantitative: >2000 m[IU]/mL — ABNORMAL HIGH (ref <5)

## 2019-10-17 LAB — URINALYSIS, ROUTINE W REFLEX MICROSCOPIC
Bilirubin Urine: NEGATIVE
Glucose, UA: NEGATIVE mg/dL
Hgb urine dipstick: NEGATIVE
Ketones, ur: NEGATIVE mg/dL
Leukocytes,Ua: NEGATIVE
Nitrite: NEGATIVE
Protein, ur: NEGATIVE mg/dL
Specific Gravity, Urine: 1.009 (ref 1.005–1.030)
pH: 7 (ref 5.0–8.0)

## 2019-10-17 LAB — COMPREHENSIVE METABOLIC PANEL
ALT: 17 U/L (ref 0–44)
AST: 20 U/L (ref 15–41)
Albumin: 3.8 g/dL (ref 3.5–5.0)
Alkaline Phosphatase: 58 U/L (ref 38–126)
Anion gap: 9 (ref 5–15)
BUN: 5 mg/dL — ABNORMAL LOW (ref 6–20)
CO2: 23 mmol/L (ref 22–32)
Calcium: 10 mg/dL (ref 8.9–10.3)
Chloride: 104 mmol/L (ref 98–111)
Creatinine, Ser: 0.76 mg/dL (ref 0.44–1.00)
GFR calc Af Amer: >60 mL/min (ref >60)
GFR calc non Af Amer: >60 mL/min (ref >60)
Glucose, Bld: 93 mg/dL (ref 70–99)
Potassium: 3.4 mmol/L — ABNORMAL LOW (ref 3.5–5.1)
Sodium: 136 mmol/L (ref 135–145)
Total Bilirubin: 0.4 mg/dL (ref 0.3–1.2)
Total Protein: 7.9 g/dL (ref 6.5–8.1)

## 2019-10-17 LAB — CBC
HCT: 37.6 % (ref 36.0–46.0)
Hemoglobin: 12.1 g/dL (ref 12.0–15.0)
MCH: 25 pg — ABNORMAL LOW (ref 26.0–34.0)
MCHC: 32.2 g/dL (ref 30.0–36.0)
MCV: 77.7 fL — ABNORMAL LOW (ref 80.0–100.0)
Platelets: 402 10*3/uL — ABNORMAL HIGH (ref 150–400)
RBC: 4.84 MIL/uL (ref 3.87–5.11)
RDW: 13.6 % (ref 11.5–15.5)
WBC: 6.7 10*3/uL (ref 4.0–10.5)
nRBC: 0 % (ref 0.0–0.2)

## 2019-10-17 LAB — LIPASE, BLOOD: Lipase: 29 U/L (ref 11–51)

## 2019-10-17 MED ORDER — SODIUM CHLORIDE 0.9% FLUSH: 3.0000 mL | Freq: Once | INTRAVENOUS | Status: DC

## 2019-10-17 NOTE — ED Triage Notes (Signed)
Patient reports intermittent hypogastric pain for 3 weeks , emesis this morning , denies fever or diarrhea .

## 2019-10-18 ENCOUNTER — Emergency Department (HOSPITAL_COMMUNITY): Payer: Medicaid Other

## 2019-10-18 MED ORDER — ONDANSETRON 4 MG PO TBDP
4.0000 mg | ORAL_TABLET | Freq: Once | ORAL | Status: AC
  Administered 2019-10-18: 4 mg via ORAL
  Filled 2019-10-18: qty 1

## 2019-10-18 MED ORDER — ONDANSETRON 4 MG PO TBDP: 4.0000 mg | ORAL_TABLET | Freq: Three times a day (TID) | ORAL | 0 refills | Status: DC | PRN

## 2019-10-18 MED ORDER — PRENATAL COMPLETE 14-0.4 MG PO TABS: 1.0000 | ORAL_TABLET | Freq: Every day | ORAL | 4 refills | Status: DC

## 2019-10-18 NOTE — ED Notes (Signed)
Pt transported to [facility]

## 2019-10-18 NOTE — ED Provider Notes (Signed)
[facility] EMERGENCY DEPARTMENT Provider Note   CSN: [id number] Arrival date & time: 10/17/19  2127     History Chief Complaint  Patient presents with  . Abdominal Pain    Donna Singh is a 24 y.o. female.  24 year old female who presents for intermittent lower abdominal pain x3 weeks.  Patient is vomiting now.  No diarrhea.  Patient states the pain feels like intense cramps.  No vaginal bleeding.  Patient states her last period was approximately December 23.  Patient states she has not had her period since then.  No back pain.  The history is provided by the patient. No language interpreter was used.  Abdominal Pain Pain location:  Suprapubic Pain quality: cramping   Pain radiates to:  Does not radiate Pain severity:  Moderate Onset quality:  Sudden Duration:  3 weeks Timing:  Intermittent Progression:  Waxing and waning Chronicity:  New Context: not eating, not recent illness and not trauma   Relieved by:  None tried Worsened by:  Nothing Ineffective treatments:  None tried Associated symptoms: nausea and vomiting   Associated symptoms: no anorexia, no constipation, no cough and no diarrhea   Risk factors: pregnancy   Risk factors: has not had multiple surgeries and no recent hospitalization        Past Medical History:  Diagnosis Date  . Medical history non-contributory     Patient Active Problem List   Diagnosis Date Noted  . Indication for care in labor or delivery 07/29/2017  . Sickle cell trait ([place]) 07/29/2017  . Positive GBS test 07/29/2017  . Fetal heart rate deceleration, delivered, current hospitalization 07/29/2017  . Status post primary low transverse cesarean section 07/29/2017    Past Surgical History:  Procedure Laterality Date  . CESAREAN SECTION N/A 07/29/2017   Procedure: CESAREAN SECTION;  Surgeon: Gwynne Edinger, MD;  Location: [facility];  Service: Obstetrics;  Laterality: N/A;     OB History    Gravida   1   Para      Term      Preterm      AB      Living        SAB      TAB      Ectopic      Multiple      Live Births  0           No family history on file.  Social History   Tobacco Use  . Smoking status: Never Smoker  . Smokeless tobacco: Never Used  Substance Use Topics  . Alcohol use: No  . Drug use: No    Home Medications Prior to Admission medications   Medication Sig Start Date End Date Taking? Authorizing Provider  cetirizine (ZYRTEC ALLERGY) 10 MG tablet Take 1 tablet (10 mg total) by mouth daily. 09/01/19   Lamptey, Myrene Galas, MD  cyclobenzaprine (FLEXERIL) 10 MG tablet Take 1 tablet (10 mg total) by mouth 3 (three) times daily as needed for muscle spasms. 03/21/19   Waldon Merl, PA-C  methocarbamol (ROBAXIN) 500 MG tablet Take 1 tablet (500 mg total) by mouth every 12 (twelve) hours as needed for muscle spasms. 08/14/18   Antonietta Breach, PA-C  naproxen (NAPROSYN) 500 MG tablet Take 1 tablet (500 mg total) by mouth every 12 (twelve) hours as needed for mild pain or moderate pain. 08/14/18   Antonietta Breach, PA-C  naproxen (NAPROSYN) 500 MG tablet Take 1 tablet (500 mg total) by  mouth 2 (two) times daily with a meal. 03/21/19   Camila Li, Sahar M, PA-C  ondansetron (ZOFRAN ODT) 4 MG disintegrating tablet Take 1 tablet (4 mg total) by mouth every 8 (eight) hours as needed for nausea or vomiting. 10/18/19   Niel Hummer, MD  Prenatal Vit-Fe Fumarate-FA (PRENATAL COMPLETE) 14-0.4 MG TABS Take 1 tablet by mouth daily. 10/18/19   Niel Hummer, MD    Allergies    Patient has no known allergies.  Review of Systems   Review of Systems  Respiratory: Negative for cough.   Gastrointestinal: Positive for abdominal pain, nausea and vomiting. Negative for anorexia, constipation and diarrhea.  All other systems reviewed and are negative.   Physical Exam Updated Vital Signs BP 100/70 (BP Location: Left Arm)   Pulse 63   Temp 98.4 F (36.9 C) (Oral)   Resp 16   SpO2 100%    Physical Exam Vitals and nursing note reviewed.  Constitutional:      Appearance: She is well-developed.  HENT:     Head: Normocephalic and atraumatic.     Right Ear: External ear normal.     Left Ear: External ear normal.  Eyes:     Conjunctiva/sclera: Conjunctivae normal.  Cardiovascular:     Rate and Rhythm: Normal rate.     Heart sounds: Normal heart sounds.  Pulmonary:     Effort: Pulmonary effort is normal.     Breath sounds: Normal breath sounds.  Abdominal:     General: Abdomen is flat. Bowel sounds are normal.     Palpations: Abdomen is soft.     Tenderness: There is abdominal tenderness in the suprapubic area. There is no rebound.     Comments: Mild suprapubic tenderness.  No rebound, no guarding.  Musculoskeletal:        General: Normal range of motion.     Cervical back: Normal range of motion and neck supple.  Skin:    General: Skin is warm.  Neurological:     Mental Status: She is alert and oriented to person, place, and time.     ED Results / Procedures / Treatments   Labs (all labs ordered are listed, but only abnormal results are displayed) Labs Reviewed  COMPREHENSIVE METABOLIC PANEL - Abnormal; Notable for the following components:      Result Value   Potassium 3.4 (*)    BUN 5 (*)    All other components within normal limits  CBC - Abnormal; Notable for the following components:   MCV 77.7 (*)    MCH 25.0 (*)    Platelets 402 (*)    All other components within normal limits  URINALYSIS, ROUTINE W REFLEX MICROSCOPIC - Abnormal; Notable for the following components:   Color, Urine STRAW (*)    All other components within normal limits  I-STAT BETA HCG BLOOD, ED ([facility], [facility], [facility] ONLY) - Abnormal; Notable for the following components:   I-stat hCG, quantitative >2,000.0 (*)    All other components within normal limits  LIPASE, BLOOD    EKG None  Radiology US OB Comp Less 14 Wks  Result Date: 10/18/2019 CLINICAL DATA:  Abdominal pain and  first-trimester pregnancy EXAM: OBSTETRIC <14 WK ULTRASOUND TECHNIQUE: Transabdominal ultrasound was performed for evaluation of the gestation as well as the maternal uterus and adnexal regions. COMPARISON:  None. FINDINGS: Intrauterine gestational sac: Present Yolk sac:  Present Embryo:  Not yet seen MSD:  16.8 mm   6 w   4 d Subchorionic hemorrhage:  None visualized. Maternal uterus/adnexae: Corpus luteum on the left. No pathologic finding. IMPRESSION: Intrauterine gestational sac with yolk sac but no visible embryo, 6 weeks 4 days by mean sac diameter. No acute finding. Electronically Signed   By: Marnee Spring M.D.   On: 10/18/2019 04:29    Procedures Procedures (including critical care time)  Medications Ordered in ED Medications  sodium chloride flush (NS) 0.9 % injection 3 mL (3 mLs Intravenous Not Given 10/18/19 0502)  ondansetron (ZOFRAN-ODT) disintegrating tablet 4 mg (4 mg Oral Given 10/18/19 0255)    ED Course  I have reviewed the triage vital signs and the nursing notes.  Pertinent labs & imaging results that were available during my care of the patient were reviewed by me and considered in my medical decision making (see chart for details).    MDM Rules/Calculators/A&P                      24 year old who presents for suprapubic pain.  No known fevers.  Patient with nausea and vomiting.  Concern for pregnancy, will send i-STAT hCG.  Concern for possible UTI, will send UA.  Will check electrolytes and CBC.  Labs been reviewed, no signs of infection on UA.  Patient with normal white count, no anemia noted.  Patient with normal electrolytes.  Normal lipase, no signs of pancreatitis.  Patient's hCG is greater than 2000, given the abdominal pain, will obtain ultrasound to see if we can verify IUP.  Ultrasound done and patient noted to have gestational sac and yolk sac in the uterus.  Discussed with the [facility] provider and no further testing is necessary at this time.  Will have patient  follow-up with faculty OB.  Will prescribe prenatal vitamins and Zofran.  Patient is aware of findings and need for follow-up.     Final Clinical Impression(s) / ED Diagnoses Final diagnoses:  Lower abdominal pain  Intrauterine pregnancy    Rx / DC Orders ED Discharge Orders         Ordered    Prenatal Vit-Fe Fumarate-FA (PRENATAL COMPLETE) 14-0.4 MG TABS  Daily     10/18/19 0452    ondansetron (ZOFRAN ODT) 4 MG disintegrating tablet  Every 8 hours PRN     10/18/19 0459           Niel Hummer, MD 10/18/19 [phone]

## 2019-10-18 NOTE — ED Notes (Signed)
RN went over d/c instructions with pt who verbalized understanding. Pt alert and no distress noted when ambulated to exit.  

## 2019-11-24 ENCOUNTER — Other Ambulatory Visit: Payer: Self-pay

## 2019-11-24 NOTE — Patient Instructions (Addendum)
There are no preventive care reminders to display for this patient.  No flowsheet data found.  Dr. Melony Overly - ENT Address: [address] [place], [place] [address] Phone: [phone]  Health Maintenance, Female Adopting a healthy lifestyle and getting preventive care are important in promoting health and wellness. Ask your health care provider about:  The right schedule for you to have regular tests and exams.  Things you can do on your own to prevent diseases and keep yourself healthy. What should I know about diet, weight, and exercise? Eat a healthy diet   Eat a diet that includes plenty of vegetables, fruits, low-fat dairy products, and lean protein.  Do not eat a lot of foods that are high in solid fats, added sugars, or sodium. Maintain a healthy weight Body mass index (BMI) is used to identify weight problems. It estimates body fat based on height and weight. Your health care provider can help determine your BMI and help you achieve or maintain a healthy weight. Get regular exercise Get regular exercise. This is one of the most important things you can do for your health. Most adults should:  Exercise for at least 150 minutes each week. The exercise should increase your heart rate and make you sweat (moderate-intensity exercise).  Do strengthening exercises at least twice a week. This is in addition to the moderate-intensity exercise.  Spend less time sitting. Even light physical activity can be beneficial. Watch cholesterol and blood lipids Have your blood tested for lipids and cholesterol at 24 years of age, then have this test every 5 years. Have your cholesterol levels checked more often if:  Your lipid or cholesterol levels are high.  You are older than 24 years of age.  You are at high risk for heart disease. What should I know about cancer screening? Depending on your health history and family history, you may need to have cancer screening at  various ages. This may include screening for:  Breast cancer.  Cervical cancer.  Colorectal cancer.  Skin cancer.  Lung cancer. What should I know about heart disease, diabetes, and high blood pressure? Blood pressure and heart disease  High blood pressure causes heart disease and increases the risk of stroke. This is more likely to develop in people who have high blood pressure readings, are of African descent, or are overweight.  Have your blood pressure checked: ? Every 3-5 years if you are 58-64 years of age. ? Every year if you are 66 years old or older. Diabetes Have regular diabetes screenings. This checks your fasting blood sugar level. Have the screening done:  Once every three years after age 33 if you are at a normal weight and have a low risk for diabetes.  More often and at a younger age if you are overweight or have a high risk for diabetes. What should I know about preventing infection? Hepatitis B If you have a higher risk for hepatitis B, you should be screened for this virus. Talk with your health care provider to find out if you are at risk for hepatitis B infection. Hepatitis C Testing is recommended for:  Everyone born from [birth year] through 1965.  Anyone with known risk factors for hepatitis C. Sexually transmitted infections (STIs)  Get screened for STIs, including gonorrhea and chlamydia, if: ? You are sexually active and are younger than 24 years of age. ? You are older than 24 years of age and your health care provider tells you that you are  at risk for this type of infection. ? Your sexual activity has changed since you were last screened, and you are at increased risk for chlamydia or gonorrhea. Ask your health care provider if you are at risk.  Ask your health care provider about whether you are at high risk for HIV. Your health care provider may recommend a prescription medicine to help prevent HIV infection. If you choose to take medicine to prevent  HIV, you should first get tested for HIV. You should then be tested every 3 months for as long as you are taking the medicine. Pregnancy  If you are about to stop having your period (premenopausal) and you may become pregnant, seek counseling before you get pregnant.  Take 400 to 800 micrograms (mcg) of folic acid every day if you become pregnant.  Ask for birth control (contraception) if you want to prevent pregnancy. Osteoporosis and menopause Osteoporosis is a disease in which the bones lose minerals and strength with aging. This can result in bone fractures. If you are 54 years old or older, or if you are at risk for osteoporosis and fractures, ask your health care provider if you should:  Be screened for bone loss.  Take a calcium or vitamin D supplement to lower your risk of fractures.  Be given hormone replacement therapy (HRT) to treat symptoms of menopause. Follow these instructions at home: Lifestyle  Do not use any products that contain nicotine or tobacco, such as cigarettes, e-cigarettes, and chewing tobacco. If you need help quitting, ask your health care provider.  Do not use street drugs.  Do not share needles.  Ask your health care provider for help if you need support or information about quitting drugs. Alcohol use  Do not drink alcohol if: ? Your health care provider tells you not to drink. ? You are pregnant, may be pregnant, or are planning to become pregnant.  If you drink alcohol: ? Limit how much you use to 0-1 drink a day. ? Limit intake if you are breastfeeding.  Be aware of how much alcohol is in your drink. In the [place] one drink equals one 12 oz bottle of beer (355 mL), one 5 oz glass of wine (148 mL), or one 1 oz glass of hard liquor (44 mL). General instructions  Schedule regular health, dental, and eye exams.  Stay current with your vaccines.  Tell your health care provider if: ? You often feel depressed. ? You have ever been abused or do  not feel safe at home. Summary  Adopting a healthy lifestyle and getting preventive care are important in promoting health and wellness.  Follow your health care provider's instructions about healthy diet, exercising, and getting tested or screened for diseases.  Follow your health care provider's instructions on monitoring your cholesterol and blood pressure. This information is not intended to replace advice given to you by your health care provider. Make sure you discuss any questions you have with your health care provider. Document Revised: 08/25/2018 Document Reviewed: 08/25/2018 Elsevier Patient Education  2020 [employer].

## 2019-11-25 ENCOUNTER — Other Ambulatory Visit (HOSPITAL_COMMUNITY)
Admission: RE | Admit: 2019-11-25 | Discharge: 2019-11-25 | Disposition: A | Discharge status: Home / Self Care | Payer: Medicaid Other | Source: Ambulatory Visit | Attending: Family Medicine | Admitting: Family Medicine

## 2019-11-25 ENCOUNTER — Ambulatory Visit (INDEPENDENT_AMBULATORY_CARE_PROVIDER_SITE_OTHER): Payer: Medicaid Other | Admitting: Family Medicine

## 2019-11-25 ENCOUNTER — Encounter: Payer: Self-pay | Admitting: Family Medicine

## 2019-11-25 VITALS — BP 119/80 | HR 73 | Temp 97.4°F | Ht 63.0 in | Wt 173.0 lb

## 2019-11-25 DIAGNOSIS — J351 Hypertrophy of tonsils: Secondary | ICD-10-CM | POA: Diagnosis not present

## 2019-11-25 DIAGNOSIS — Z124 Encounter for screening for malignant neoplasm of cervix: Secondary | ICD-10-CM | POA: Insufficient documentation

## 2019-11-25 DIAGNOSIS — Z0000 Encounter for general adult medical examination without abnormal findings: Secondary | ICD-10-CM

## 2019-11-25 LAB — BASIC METABOLIC PANEL
BUN: 7 mg/dL (ref 6–23)
CO2: 26 mEq/L (ref 19–32)
Calcium: 9.5 mg/dL (ref 8.4–10.5)
Chloride: 103 mEq/L (ref 96–112)
Creatinine, Ser: 0.69 mg/dL (ref 0.40–1.20)
GFR: 127.18 mL/min (ref >60.00)
Glucose, Bld: 75 mg/dL (ref 70–99)
Potassium: 4 mEq/L (ref 3.5–5.1)
Sodium: 135 mEq/L (ref 135–145)

## 2019-11-25 LAB — LIPID PANEL
Cholesterol: 224 mg/dL — ABNORMAL HIGH (ref 0–200)
HDL: 61.3 mg/dL (ref >39.00)
LDL Cholesterol: 152 mg/dL — ABNORMAL HIGH (ref 0–99)
NonHDL: 162.45
Total CHOL/HDL Ratio: 4
Triglycerides: 51 mg/dL (ref 0.0–149.0)
VLDL: 10.2 mg/dL (ref 0.0–40.0)

## 2019-11-25 LAB — CBC
HCT: 31.8 % — ABNORMAL LOW (ref 36.0–46.0)
Hemoglobin: 10.5 g/dL — ABNORMAL LOW (ref 12.0–15.0)
MCHC: 33.2 g/dL (ref 30.0–36.0)
MCV: 77.6 fl — ABNORMAL LOW (ref 78.0–100.0)
Platelets: 367 10*3/uL (ref 150.0–400.0)
RBC: 4.1 Mil/uL (ref 3.87–5.11)
RDW: 13.4 % (ref 11.5–15.5)
WBC: 4.4 10*3/uL (ref 4.0–10.5)

## 2019-11-25 LAB — AST: AST: 18 U/L (ref 0–37)

## 2019-11-25 LAB — ALT: ALT: 11 U/L (ref 0–35)

## 2019-11-25 NOTE — Progress Notes (Signed)
Donna Singh is a 24 y.o. female  Chief Complaint  Patient presents with  . Establish Care    Pt here for physical.  Pt is due for a pap smear.  Pt is fasting for lab work today.    HPI: Donna Singh is a 24 y.o. female here as a new patient for CPE, fasting labs, PAP. 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 She is a Lawyer.   She complains of recurrent sore throat, tonsils stones. Tonsils feel enlarged x months. Treated in the past with abx, ? Steroids without change. She would like ENT referral.  Vision: wear glasses, UTD on exam Dental: has appt on 3/25  Last menstrual cycle: pt is unsure. She recently had an elective abortion on 10/26/19. No complications.  Past Medical History:  Diagnosis Date  . Medical history non-contributory     Past Surgical History:  Procedure Laterality Date  . CESAREAN SECTION N/A 07/29/2017   Procedure: CESAREAN SECTION;  Surgeon: Kathrynn Running, MD;  Location: [facility];  Service: Obstetrics;  Laterality: N/A;    Social History   Socioeconomic History  . Marital status: Single    Spouse name: Not on file  . Number of children: Not on file  . Years of education: Not on file  . Highest education level: Not on file  Occupational History  . Not on file  Tobacco Use  . Smoking status: Never Smoker  . Smokeless tobacco: Never Used  Substance and Sexual Activity  . Alcohol use: No  . Drug use: No  . Sexual activity: Never  Other Topics Concern  . 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    Social Determinants of Health   Financial Resource Strain:   . Difficulty of Paying Living Expenses:   Food Insecurity:   . Worried About Programme researcher, broadcasting/film/video in the Last Year:   . Barista in the Last Year:   Transportation Needs:   . Freight forwarder (Medical):   Marland Kitchen Lack of Transportation (Non-Medical):   Physical Activity:   . Days of Exercise per Week:   . Minutes of Exercise per Session:     Stress:   . Feeling of Stress :   Social Connections:   . Frequency of Communication with Friends and Family:   . Frequency of Social Gatherings with Friends and Family:   . Attends Religious Services:   . Active Member of Clubs or Organizations:   . Attends Banker Meetings:   Marland Kitchen Marital Status:   Intimate Partner Violence:   . Fear of Current or Ex-Partner:   . Emotionally Abused:   Marland Kitchen Physically Abused:   . Sexually Abused:     History reviewed. No pertinent family history.   There is no immunization history for the selected administration types on file for this patient.  Outpatient Encounter Medications as of 11/25/2019  Medication Sig  . Prenatal Vit-Fe Fumarate-FA (PRENATAL COMPLETE) 14-0.4 MG TABS Take 1 tablet by mouth daily. (Patient not taking: Reported on 11/25/2019)  . [DISCONTINUED] cetirizine (ZYRTEC ALLERGY) 10 MG tablet Take 1 tablet (10 mg total) by mouth daily. (Patient not taking: Reported on 11/25/2019)  . [DISCONTINUED] cyclobenzaprine (FLEXERIL) 10 MG tablet Take 1 tablet (10 mg total) by mouth 3 (three) times daily as needed for muscle spasms. (Patient not taking: Reported on 11/25/2019)  . [DISCONTINUED] methocarbamol (ROBAXIN) 500 MG tablet Take 1 tablet (500  mg total) by mouth every 12 (twelve) hours as needed for muscle spasms. (Patient not taking: Reported on 11/25/2019)  . [DISCONTINUED] naproxen (NAPROSYN) 500 MG tablet Take 1 tablet (500 mg total) by mouth every 12 (twelve) hours as needed for mild pain or moderate pain. (Patient not taking: Reported on 11/25/2019)  . [DISCONTINUED] naproxen (NAPROSYN) 500 MG tablet Take 1 tablet (500 mg total) by mouth 2 (two) times daily with a meal. (Patient not taking: Reported on 11/25/2019)  . [DISCONTINUED] ondansetron (ZOFRAN ODT) 4 MG disintegrating tablet Take 1 tablet (4 mg total) by mouth every 8 (eight) hours as needed for nausea or vomiting. (Patient not taking: Reported on 11/25/2019)   No  facility-administered encounter medications on file as of 11/25/2019.     ROS: Gen: no fever, chills  Skin: no rash, itching ENT: as above in HPI Eyes: no blurry vision, double vision Resp: no cough, wheeze,SOB Breast: no breast tenderness, no nipple discharge, no breast masses CV: no CP, palpitations, LE edema,  GI: no heartburn, n/v/d/c, abd pain GU: no dysuria, urgency, frequency, hematuria; no vaginal itching, odor, discharge MSK: no joint pain, myalgias, back pain Neuro: no dizziness, headache, weakness Psych: no depression, anxiety, insomnia   No Known Allergies  BP 119/80 (BP Location: Left Arm, Patient Position: Sitting, Cuff Size: Normal)   Pulse 73   Temp (!) 97.4 F (36.3 C) (Temporal)   Ht 5\' 3"  (1.6 m)   Wt 173 lb (78.5 kg)   LMP  (LMP Unknown)   SpO2 99%   BMI 30.65 kg/m   Physical Exam  Constitutional: She is oriented to person, place, and time. She appears well-developed and well-nourished. No distress.  HENT:  Head: Normocephalic and atraumatic.  Right Ear: Tympanic membrane and ear canal normal.  Left Ear: Tympanic membrane and ear canal normal.  Nose: Nose normal.  Mouth/Throat: Oropharynx is clear and moist and mucous membranes are normal. No posterior oropharyngeal edema or posterior oropharyngeal erythema.  Enlarged tonsils, no tonsil stones noted, no erythema or exudate  Eyes: Pupils are equal, round, and reactive to light. Conjunctivae are normal.  Neck: No thyromegaly present.  Cardiovascular: Normal rate, regular rhythm, normal heart sounds and intact distal pulses.  No murmur heard. Pulmonary/Chest: Effort normal and breath sounds normal. No respiratory distress. She has no wheezes. She has no rhonchi. Right breast exhibits no mass, no nipple discharge, no skin change and no tenderness. Left breast exhibits no mass, no nipple discharge, no skin change and no tenderness.  Abdominal: Soft. Bowel sounds are normal. She exhibits no distension and no  mass. There is no abdominal tenderness.  Genitourinary:    Vagina and uterus normal.  There is no rash, tenderness or lesion on the right labia. There is no rash, tenderness or lesion on the left labia. Cervix exhibits no motion tenderness, no discharge and no friability. Right adnexum displays no mass, no tenderness and no fullness. Left adnexum displays no mass, no tenderness and no fullness.  Musculoskeletal:        General: No edema.     Cervical back: Neck supple.  Lymphadenopathy:    She has no cervical adenopathy.  Neurological: She is alert and oriented to person, place, and time. She exhibits normal muscle tone. Coordination normal.  Skin: Skin is warm and dry.  Psychiatric: She has a normal mood and affect. Her behavior is normal.     A/P:  1. Annual physical exam - UTD on vision, dental exam schedule - discussed  importance of regular CV exercise, healthy diet, adequate sleep - PAP today - immunizations UTD - Tdap in 2018 during pregnacy, flu vaccine in fall 2020 - ALT - AST - Basic metabolic panel - CBC - Lipid panel - next CPE in 1 year  2. Screening for cervical cancer - Cytology - PAP( [place])  3. Enlarged tonsils - Ambulatory referral to ENT   This visit occurred during the SARS-CoV-2 public health emergency.  Safety protocols were in place, including screening questions prior to the visit, additional usage of staff PPE, and extensive cleaning of exam room while observing appropriate contact time as indicated for disinfecting solutions.

## 2019-11-28 ENCOUNTER — Encounter: Payer: Self-pay | Admitting: Family Medicine

## 2019-11-29 ENCOUNTER — Encounter: Payer: Self-pay | Admitting: Family Medicine

## 2019-11-30 LAB — CYTOLOGY - PAP
Comment: NEGATIVE
High risk HPV: NEGATIVE

## 2019-12-01 ENCOUNTER — Encounter: Payer: Self-pay | Admitting: Family Medicine

## 2019-12-03 ENCOUNTER — Encounter: Payer: Self-pay | Admitting: Family Medicine

## 2019-12-05 ENCOUNTER — Encounter: Payer: Self-pay | Admitting: Family Medicine

## 2019-12-08 ENCOUNTER — Other Ambulatory Visit: Payer: Self-pay

## 2019-12-08 ENCOUNTER — Encounter: Payer: Self-pay | Admitting: Family Medicine

## 2019-12-09 ENCOUNTER — Encounter: Payer: Self-pay | Admitting: Family Medicine

## 2019-12-09 ENCOUNTER — Ambulatory Visit: Payer: Medicaid Other | Admitting: Family Medicine

## 2019-12-09 VITALS — BP 98/70 | HR 78 | Temp 98.4°F | Ht 63.0 in | Wt 174.0 lb

## 2019-12-09 DIAGNOSIS — Z3009 Encounter for other general counseling and advice on contraception: Secondary | ICD-10-CM | POA: Diagnosis not present

## 2019-12-09 DIAGNOSIS — J358 Other chronic diseases of tonsils and adenoids: Secondary | ICD-10-CM | POA: Insufficient documentation

## 2019-12-09 DIAGNOSIS — E041 Nontoxic single thyroid nodule: Secondary | ICD-10-CM | POA: Insufficient documentation

## 2019-12-09 NOTE — Patient Instructions (Signed)
[facility]  [email or web address] [address], [place] [address] P: [phone] F: [phone] [email or web address]  [facility] [email or web address] [address], [place], [place] [address] fax: [phone] [email or web address]    Etonogestrel implant What is this medicine? ETONOGESTREL (et oh noe JES trel) is a contraceptive (birth control) device. It is used to prevent pregnancy. It can be used for up to 3 years. This medicine may be used for other purposes; ask your health care provider or pharmacist if you have questions. COMMON BRAND NAME(S): Implanon, Nexplanon What should I tell my health care provider before I take this medicine? They need to know if you have any of these conditions:  abnormal vaginal bleeding  blood vessel disease or blood clots  breast, cervical, endometrial, ovarian, liver, or uterine cancer  diabetes  gallbladder disease  heart disease or recent heart attack  high blood pressure  high cholesterol or triglycerides  kidney disease  liver disease  migraine headaches  seizures  stroke  tobacco smoker  an unusual or allergic reaction to etonogestrel, anesthetics or antiseptics, other medicines, foods, dyes, or preservatives  pregnant or trying to get pregnant  breast-feeding How should I use this medicine? This device is inserted just under the skin on the inner side of your upper arm by a health care professional. Talk to your pediatrician regarding the use of this medicine in children. Special care may be needed. Overdosage: If you think you have taken too much of this medicine contact a poison control center or emergency room at once. NOTE: This medicine is only for you. Do not share this medicine with others. What if I miss a dose? This does not apply. What may interact with this medicine? Do not take this medicine with any of the  following medications:  amprenavir  fosamprenavir This medicine may also interact with the following medications:  acitretin  aprepitant  armodafinil  bexarotene  bosentan  carbamazepine  certain medicines for fungal infections like fluconazole, ketoconazole, itraconazole and voriconazole  certain medicines to treat hepatitis, HIV or AIDS  cyclosporine  felbamate  griseofulvin  lamotrigine  modafinil  oxcarbazepine  phenobarbital  phenytoin  primidone  rifabutin  rifampin  rifapentine  St. John's wort  topiramate This list may not describe all possible interactions. Give your health care provider a list of all the medicines, herbs, non-prescription drugs, or dietary supplements you use. Also tell them if you smoke, drink alcohol, or use illegal drugs. Some items may interact with your medicine. What should I watch for while using this medicine? This product does not protect you against HIV infection (AIDS) or other sexually transmitted diseases. You should be able to feel the implant by pressing your fingertips over the skin where it was inserted. Contact your doctor if you cannot feel the implant, and use a non-hormonal birth control method (such as condoms) until your doctor confirms that the implant is in place. Contact your doctor if you think that the implant may have broken or become bent while in your arm. You will receive a user card from your health care provider after the implant is inserted. The card is a record of the location of the implant in your upper arm and when it should be removed. Keep this card with your health records. What side effects may I notice from receiving this medicine? Side effects that you should report to your doctor or health care professional as soon as possible:  allergic reactions like  skin rash, itching or hives, swelling of the face, lips, or tongue  breast lumps, breast tissue changes, or discharge  breathing  problems  changes in emotions or moods  coughing up blood  if you feel that the implant may have broken or bent while in your arm  high blood pressure  pain, irritation, swelling, or bruising at the insertion site  scar at site of insertion  signs of infection at the insertion site such as fever, and skin redness, pain or discharge  signs and symptoms of a blood clot such as breathing problems; changes in vision; chest pain; severe, sudden headache; pain, swelling, warmth in the leg; trouble speaking; sudden numbness or weakness of the face, arm or leg  signs and symptoms of liver injury like dark yellow or brown urine; general ill feeling or flu-like symptoms; light-colored stools; loss of appetite; nausea; right upper belly pain; unusually weak or tired; yellowing of the eyes or skin  unusual vaginal bleeding, discharge Side effects that usually do not require medical attention (report to your doctor or health care professional if they continue or are bothersome):  acne  breast pain or tenderness  headache  irregular menstrual bleeding  nausea This list may not describe all possible side effects. Call your doctor for medical advice about side effects. You may report side effects to FDA at 1-800-FDA-1088. Where should I keep my medicine? This drug is given in a hospital or clinic and will not be stored at home. NOTE: This sheet is a summary. It may not cover all possible information. If you have questions about this medicine, talk to your doctor, pharmacist, or health care provider.  2020 Elsevier/Gold Standard (2019-06-14 11:33:04)

## 2019-12-09 NOTE — Progress Notes (Signed)
Donna Singh is a 24 y.o. female  Chief Complaint  Patient presents with  . Contraception    Pt would like to discuss options.    HPI: Donna Singh is a 24 y.o. female who would like to discuss long term contraceptive options. She wants to know if whatever she decides on can be done today.  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 She does not want to get pregnant in the next few years. She does not want to take pill or use patch or nuvaring as she is concerned she will forget to take, change.   Past Medical History:  Diagnosis Date  . Medical history non-contributory     Past Surgical History:  Procedure Laterality Date  . CESAREAN SECTION N/A 07/29/2017   Procedure: CESAREAN SECTION;  Surgeon: Kathrynn Running, MD;  Location: [facility] BIRTHING SUITES;  Service: Obstetrics;  Laterality: N/A;    Social History   Socioeconomic History  . Marital status: Single    Spouse name: Not on file  . Number of children: Not on file  . Years of education: Not on file  . Highest education level: Not on file  Occupational History  . Not on file  Tobacco Use  . Smoking status: Never Smoker  . Smokeless tobacco: Never Used  Substance and Sexual Activity  . Alcohol use: No  . Drug use: No  . Sexual activity: Never  Other Topics Concern  . 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    Social Determinants of Health   Financial Resource Strain:   . Difficulty of Paying Living Expenses:   Food Insecurity:   . Worried About Programme researcher, broadcasting/film/video in the Last Year:   . Barista in the Last Year:   Transportation Needs:   . Freight forwarder (Medical):   Marland Kitchen Lack of Transportation (Non-Medical):   Physical Activity:   . Days of Exercise per Week:   . Minutes of Exercise per Session:   Stress:   . Feeling of Stress :   Social Connections:   . Frequency of Communication with Friends and Family:   . Frequency of Social Gatherings with Friends and Family:     . Attends Religious Services:   . Active Member of Clubs or Organizations:   . Attends Banker Meetings:   Marland Kitchen Marital Status:   Intimate Partner Violence:   . Fear of Current or Ex-Partner:   . Emotionally Abused:   Marland Kitchen Physically Abused:   . Sexually Abused:     History reviewed. No pertinent family history.   There is no immunization history for the selected administration types on file for this patient.  No outpatient encounter medications on file as of 12/09/2019.   No facility-administered encounter medications on file as of 12/09/2019.     ROS: Pertinent positives and negatives noted in HPI. Remainder of ROS non-contributory    No Known Allergies  BP 98/70 (BP Location: Left Arm, Patient Position: Sitting, Cuff Size: Normal)   Pulse 78   Temp 98.4 F (36.9 C) (Temporal)   Ht 5\' 3"  (1.6 m)   Wt 174 lb (78.9 kg)   LMP  (LMP Unknown)   SpO2 99%   BMI 30.82 kg/m   Physical Exam  Constitutional: She is oriented to person, place, and time. She appears well-developed and well-nourished. No distress.  Pulmonary/Chest: No respiratory distress.  Neurological: She is alert and  oriented to person, place, and time.  Psychiatric: She has a normal mood and affect. Her behavior is normal.     A/P:  1. Birth control counseling - dicussed all birth control options including OCP, depo, patch, nuvaring, IUD, and nexplanon - pt would like to discuss and proceed with nexplanon. I explained that neither I nor any provider in this office places this and I will need to refer her to GYN. Pt is agreeable - Ambulatory referral to Obstetrics / Gynecology    This visit occurred during the SARS-CoV-2 public health emergency.  Safety protocols were in place, including screening questions prior to the visit, additional usage of staff PPE, and extensive cleaning of exam room while observing appropriate contact time as indicated for disinfecting solutions.

## 2019-12-14 ENCOUNTER — Encounter: Payer: Self-pay | Admitting: Family Medicine

## 2019-12-14 NOTE — Telephone Encounter (Signed)
Donna Singh, Can you please update referral to OB-GYN office that accepts medicaid? Pt was being referred for nexplanon insertion for contraception. Thanks!

## 2019-12-15 ENCOUNTER — Other Ambulatory Visit: Payer: Self-pay | Admitting: Otolaryngology

## 2019-12-15 DIAGNOSIS — E041 Nontoxic single thyroid nodule: Secondary | ICD-10-CM

## 2019-12-21 ENCOUNTER — Ambulatory Visit
Admission: RE | Admit: 2019-12-21 | Discharge: 2019-12-21 | Disposition: A | Discharge status: Home / Self Care | Payer: Medicaid Other | Source: Ambulatory Visit | Attending: Otolaryngology | Admitting: Otolaryngology

## 2019-12-21 DIAGNOSIS — E041 Nontoxic single thyroid nodule: Secondary | ICD-10-CM

## 2020-01-30 ENCOUNTER — Ambulatory Visit (INDEPENDENT_AMBULATORY_CARE_PROVIDER_SITE_OTHER): Payer: Medicaid Other | Admitting: Family Medicine

## 2020-01-30 ENCOUNTER — Other Ambulatory Visit: Payer: Self-pay

## 2020-01-30 ENCOUNTER — Encounter: Payer: Self-pay | Admitting: Family Medicine

## 2020-01-30 VITALS — BP 104/68 | HR 56 | Wt 176.0 lb

## 2020-01-30 DIAGNOSIS — Z30017 Encounter for initial prescription of implantable subdermal contraceptive: Secondary | ICD-10-CM | POA: Diagnosis not present

## 2020-01-30 DIAGNOSIS — Z3202 Encounter for pregnancy test, result negative: Secondary | ICD-10-CM | POA: Diagnosis not present

## 2020-01-30 LAB — POCT PREGNANCY, URINE: Preg Test, Ur: NEGATIVE

## 2020-01-30 MED ORDER — ETONOGESTREL 68 MG ~~LOC~~ IMPL
68.0000 mg | DRUG_IMPLANT | Freq: Once | SUBCUTANEOUS | Status: AC
  Administered 2020-01-30: 68 mg via SUBCUTANEOUS

## 2020-01-30 NOTE — Progress Notes (Signed)
Had Nexplanon In 2016

## 2020-01-30 NOTE — Progress Notes (Signed)
Here for nexplanon insertion. Had one in 2016. May have had irregular periods, but did well.   Nexplanon Insertion:  Patient given informed consent, signed copy in the chart, time out was performed. Pregnancy test was neg. Appropriate time out taken.  Patient's left arm was prepped and draped in the usual sterile fashion.. The ruler used to measure and mark insertion area.  Pt was prepped with alcohol swab and then injected with 59mL of 2% lidocaine with epinephrine.  Pt was prepped with betadine, Implanon removed form packaging,  Device confirmed in needle, then inserted full length of needle and withdrawn per handbook instructions.  Device palpated by physician and patient.  Pt insertion site covered with pressure dressing.   Minimal blood loss.  Pt tolerated the procedure well.

## 2020-01-30 NOTE — Patient Instructions (Signed)
Nexplanon Instructions After Insertion  Keep bandage clean and dry for 24 hours  May use ice/Tylenol/Ibuprofen for soreness or pain  If you develop fever, drainage or increased warmth from incision site-contact office immediately   

## 2020-02-16 ENCOUNTER — Encounter: Payer: Self-pay | Admitting: Family Medicine

## 2020-07-12 ENCOUNTER — Encounter: Payer: Self-pay | Admitting: Family Medicine

## 2020-07-20 ENCOUNTER — Encounter: Payer: Self-pay | Admitting: Family Medicine

## 2020-07-23 MED ORDER — NORGESTIMATE-ETH ESTRADIOL 0.25-35 MG-MCG PO TABS: 1.0000 | ORAL_TABLET | Freq: Every day | ORAL | 1 refills | Status: DC

## 2020-08-01 ENCOUNTER — Ambulatory Visit: Payer: Medicaid Other | Admitting: Family Medicine

## 2020-08-03 ENCOUNTER — Telehealth (INDEPENDENT_AMBULATORY_CARE_PROVIDER_SITE_OTHER): Payer: Medicaid Other | Admitting: Family Medicine

## 2020-08-03 ENCOUNTER — Encounter: Payer: Self-pay | Admitting: Family Medicine

## 2020-08-03 VITALS — Ht 63.0 in | Wt 171.2 lb

## 2020-08-03 DIAGNOSIS — J351 Hypertrophy of tonsils: Secondary | ICD-10-CM

## 2020-08-03 NOTE — Progress Notes (Signed)
Virtual Visit via Video Note  I connected with Donna Singh on 08/03/20 at  2:00 PM EST by a video enabled telemedicine application and verified that I am speaking with the correct person using two identifiers. Location patient: home Location provider: work  Persons participating in the virtual visit: patient, provider  I discussed the limitations of evaluation and management by telemedicine and the availability of in person appointments. The patient expressed understanding and agreed to proceed.  Chief Complaint  Patient presents with   Follow-up    discuss getting a referall to ENT to get tonsils removed.   declines flu and covid.     HPI: Donna Singh is a 24 y.o. female seen today requesting a referral to ENT to discuss recurrent sore throat, tonsil stones, and possible tonsillectomy. We had discussed this at her annual CPE appt in 11/2019 and referral was placed to Dr. Ezzard Standing. Unfortunately, his office does not accept medicaid patients so referral was declined.  She complains of recurrent sore throat, tonsil stones. Tonsils feel enlarged x months. Treated in the past with abx, ? Steroids without change.    Past Medical History:  Diagnosis Date   Medical history non-contributory     Past Surgical History:  Procedure Laterality Date   CESAREAN SECTION N/A 07/29/2017   Procedure: CESAREAN SECTION;  Surgeon: Kathrynn Running, MD;  Location: [facility] BIRTHING SUITES;  Service: Obstetrics;  Laterality: N/A;    History reviewed. No pertinent family history.  Social History   Tobacco Use   Smoking status: Never Smoker   Smokeless tobacco: Never Used  Vaping Use   Vaping Use: Never used  Substance Use Topics   Alcohol use: No   Drug use: No     Current Outpatient Medications:    norgestimate-ethinyl estradiol (ORTHO-CYCLEN) 0.25-35 MG-MCG tablet, Take 1 tablet by mouth daily., Disp: 84 tablet, Rfl: 1  No Known Allergies    ROS: See pertinent positives and  negatives per HPI.   EXAM:  VITALS per patient if applicable: Ht 5\' 3"  (1.6 m)    Wt 171 lb 3.2 oz (77.7 kg) Comment: pt reported   BMI 30.33 kg/m    GENERAL: alert, oriented, appears well and in no acute distress  HEENT: atraumatic, conjunctiva clear, no obvious abnormalities on inspection of external nose and ears  NECK: normal movements of the head and neck  LUNGS: on inspection no signs of respiratory distress, breathing rate appears normal, no obvious gross SOB, gasping or wheezing, no conversational dyspnea  CV: no obvious cyanosis  PSYCH/NEURO: pleasant and cooperative, no obvious depression or anxiety, speech and thought processing grossly intact   ASSESSMENT AND PLAN: 1. Enlarged tonsils - discussed with pt at CPE appt in 11/2019 - ENT she was referred to at that time did not take her insurance - Ambulatory referral to ENT Dr. 12/2019, will send pt contact info via MyChart    I discussed the assessment and treatment plan with the patient. The patient was provided an opportunity to ask questions and all were answered. The patient agreed with the plan and demonstrated an understanding of the instructions.   The patient was advised to call back or seek an in-person evaluation if the symptoms worsen or if the condition fails to improve as anticipated.   Suszanne Conners, DO

## 2020-08-03 NOTE — Patient Instructions (Signed)
  Dr. Suszanne Conners [facility] [address], [place], [place] [address] [phone]

## 2020-11-19 ENCOUNTER — Encounter (HOSPITAL_COMMUNITY): Payer: Self-pay | Admitting: Emergency Medicine

## 2020-11-19 ENCOUNTER — Emergency Department (HOSPITAL_COMMUNITY)
Admission: EM | Admit: 2020-11-19 | Discharge: 2020-11-19 | Disposition: A | Discharge status: Home / Self Care | Payer: Medicaid Other | Attending: Emergency Medicine | Admitting: Emergency Medicine

## 2020-11-19 ENCOUNTER — Other Ambulatory Visit: Payer: Self-pay

## 2020-11-19 DIAGNOSIS — H9203 Otalgia, bilateral: Secondary | ICD-10-CM | POA: Diagnosis not present

## 2020-11-19 DIAGNOSIS — J029 Acute pharyngitis, unspecified: Secondary | ICD-10-CM | POA: Diagnosis not present

## 2020-11-19 DIAGNOSIS — R1310 Dysphagia, unspecified: Secondary | ICD-10-CM | POA: Insufficient documentation

## 2020-11-19 DIAGNOSIS — G8918 Other acute postprocedural pain: Secondary | ICD-10-CM | POA: Insufficient documentation

## 2020-11-19 LAB — CBC WITH DIFFERENTIAL/PLATELET
Abs Immature Granulocytes: 0.02 10*3/uL (ref 0.00–0.07)
Basophils Absolute: 0.1 10*3/uL (ref 0.0–0.1)
Basophils Relative: 1 %
Eosinophils Absolute: 0.1 10*3/uL (ref 0.0–0.5)
Eosinophils Relative: 1 %
HCT: 36.4 % (ref 36.0–46.0)
Hemoglobin: 11.8 g/dL — ABNORMAL LOW (ref 12.0–15.0)
Immature Granulocytes: 0 %
Lymphocytes Relative: 33 %
Lymphs Abs: 3 10*3/uL (ref 0.7–4.0)
MCH: 25.9 pg — ABNORMAL LOW (ref 26.0–34.0)
MCHC: 32.4 g/dL (ref 30.0–36.0)
MCV: 79.8 fL — ABNORMAL LOW (ref 80.0–100.0)
Monocytes Absolute: 0.7 10*3/uL (ref 0.1–1.0)
Monocytes Relative: 7 %
Neutro Abs: 5.4 10*3/uL (ref 1.7–7.7)
Neutrophils Relative %: 58 %
Platelets: 305 10*3/uL (ref 150–400)
RBC: 4.56 MIL/uL (ref 3.87–5.11)
RDW: 13.2 % (ref 11.5–15.5)
WBC: 9.2 10*3/uL (ref 4.0–10.5)
nRBC: 0 % (ref 0.0–0.2)

## 2020-11-19 LAB — BASIC METABOLIC PANEL
Anion gap: 11 (ref 5–15)
BUN: 5 mg/dL — ABNORMAL LOW (ref 6–20)
CO2: 24 mmol/L (ref 22–32)
Calcium: 9.6 mg/dL (ref 8.9–10.3)
Chloride: 103 mmol/L (ref 98–111)
Creatinine, Ser: 0.76 mg/dL (ref 0.44–1.00)
GFR, Estimated: >60 mL/min (ref >60)
Glucose, Bld: 87 mg/dL (ref 70–99)
Potassium: 3.2 mmol/L — ABNORMAL LOW (ref 3.5–5.1)
Sodium: 138 mmol/L (ref 135–145)

## 2020-11-19 LAB — I-STAT BETA HCG BLOOD, ED (MC, WL, AP ONLY): I-stat hCG, quantitative: <5 m[IU]/mL (ref <5)

## 2020-11-19 MED ORDER — KETOROLAC TROMETHAMINE 15 MG/ML IJ SOLN
15.0000 mg | Freq: Once | INTRAMUSCULAR | Status: AC
  Administered 2020-11-19: 15 mg via INTRAVENOUS
  Filled 2020-11-19: qty 1

## 2020-11-19 MED ORDER — SODIUM CHLORIDE 0.9 % IV BOLUS
1000.0000 mL | Freq: Once | INTRAVENOUS | Status: AC
  Administered 2020-11-19: 1000 mL via INTRAVENOUS

## 2020-11-19 NOTE — ED Triage Notes (Signed)
Patient reports dizziness/lightheaded and headache today , tonsillectomy last 11/16/20 , no oral swelling/respirations unlabored.

## 2020-11-19 NOTE — ED Provider Notes (Signed)
[facility] EMERGENCY DEPARTMENT Provider Note   CSN: [id number] Arrival date & time: 11/19/20  0112     History Chief Complaint  Patient presents with  . Headache/Dizzy( Tonsillectomy 11/16/20)    Donna Singh is a 25 y.o. female.  25 year old female presents to the emergency department for evaluation of postoperative pain.  She reports bilateral ear pressure and sore throat status post tonsillectomy 3 days ago.  She states that she has been using ibuprofen and liquid hydrocodone at home for pain without significant improvement.  Has found it difficult to stay hydrated due to pain with swallowing liquids.  She continues to tolerate her secretions without difficulty.  No shortness of breath, drooling.  Denies any discharge or bleeding in her posterior oropharynx.  She did not have planned follow-up with ENT for another 3 weeks.        Past Medical History:  Diagnosis Date  . Medical history non-contributory     Patient Active Problem List   Diagnosis Date Noted  . Indication for care in labor or delivery 07/29/2017  . Sickle cell trait (HCC) 07/29/2017  . Positive GBS test 07/29/2017  . Fetal heart rate deceleration, delivered, current hospitalization 07/29/2017  . Status post primary low transverse cesarean section 07/29/2017    Past Surgical History:  Procedure Laterality Date  . CESAREAN SECTION N/A 07/29/2017   Procedure: CESAREAN SECTION;  Surgeon: Kathrynn Running, MD;  Location: [facility] BIRTHING SUITES;  Service: Obstetrics;  Laterality: N/A;  . TONSILLECTOMY       OB History    Gravida  1   Para      Term      Preterm      AB      Living        SAB      IAB      Ectopic      Multiple      Live Births  0           No family history on file.  Social History   Tobacco Use  . Smoking status: Never Smoker  . Smokeless tobacco: Never Used  Vaping Use  . Vaping Use: Never used  Substance Use Topics  . Alcohol use: No  .  Drug use: No    Home Medications Prior to Admission medications   Medication Sig Start Date End Date Taking? Authorizing Provider  HYDROcodone-acetaminophen (HYCET) 7.5-325 mg/15 ml solution Take 15 mLs by mouth every 4 (four) hours as needed for moderate pain or severe pain. 11/15/20  Yes [provider]  ondansetron (ZOFRAN) 8 MG tablet Take 8 mg by mouth every 8 (eight) hours as needed for nausea or vomiting. 11/15/20 11/25/20 Yes [provider]  norgestimate-ethinyl estradiol (ORTHO-CYCLEN) 0.25-35 MG-MCG tablet Take 1 tablet by mouth daily. Patient not taking: Reported on 11/19/2020 07/23/20   Levie Heritage, DO    Allergies    Patient has no known allergies.  Review of Systems   Review of Systems  Ten systems reviewed and are negative for acute change, except as noted in the HPI.    Physical Exam Updated Vital Signs BP 102/70 (BP Location: Right Arm)   Pulse (!) 58   Temp 98.7 F (37.1 C) (Oral)   Resp 18   Ht 5\' 3"  (1.6 m)   Wt 85 kg   SpO2 99%   BMI 33.19 kg/m   Physical Exam Vitals and nursing note reviewed.  Constitutional:  General: She is not in acute distress.    Appearance: She is well-developed and well-nourished. She is not diaphoretic.     Comments: Nontoxic appearing and in NAD  HENT:     Head: Normocephalic and atraumatic.     Right Ear: Tympanic membrane, ear canal and external ear normal.     Left Ear: Tympanic membrane, ear canal and external ear normal.     Mouth/Throat:     Mouth: Mucous membranes are moist.     Comments: Eschars from recent tonsillectomy. No purulence or drainage in the posterior oropharynx. Speech clear. Tongue midline. Eyes:     General: No scleral icterus.    Extraocular Movements: EOM normal.     Conjunctiva/sclera: Conjunctivae normal.  Neck:     Comments: No meningismus Pulmonary:     Effort: Pulmonary effort is normal. No respiratory distress.     Comments: Respirations even and unlabored. No  stridor. Musculoskeletal:        General: Normal range of motion.     Cervical back: Normal range of motion and neck supple.  Skin:    General: Skin is warm and dry.     Coloration: Skin is not pale.     Findings: No erythema or rash.  Neurological:     Mental Status: She is alert and oriented to person, place, and time.     Coordination: Coordination normal.  Psychiatric:        Mood and Affect: Mood and affect normal. Affect is tearful.        Behavior: Behavior normal.     ED Results / Procedures / Treatments   Labs (all labs ordered are listed, but only abnormal results are displayed) Labs Reviewed  CBC WITH DIFFERENTIAL/PLATELET - Abnormal; Notable for the following components:      Result Value   Hemoglobin 11.8 (*)    MCV 79.8 (*)    MCH 25.9 (*)    All other components within normal limits  BASIC METABOLIC PANEL - Abnormal; Notable for the following components:   Potassium 3.2 (*)    BUN 5 (*)    All other components within normal limits  I-STAT BETA HCG BLOOD, ED ([facility], [facility], [facility] ONLY)    EKG None  Radiology No results found.  Procedures Procedures   Medications Ordered in ED Medications  ketorolac (TORADOL) 15 MG/ML injection 15 mg (15 mg Intravenous Given 11/19/20 0346)  sodium chloride 0.9 % bolus 1,000 mL (1,000 mLs Intravenous New Bag/Given 11/19/20 0345)    ED Course  I have reviewed the triage vital signs and the nursing notes.  Pertinent labs & imaging results that were available during my care of the patient were reviewed by me and considered in my medical decision making (see chart for details).  Clinical Course as of 11/19/20 0510  Mon Nov 19, 2020  0510 Hydrated with 1 L of IV fluids.  States that her pain is significantly improved since receiving Toradol. [KH]    Clinical Course User Index [KH] Darylene Price   MDM Rules/Calculators/A&P                          25 year old female presents to the emergency department for postoperative  pain and dysphagia following tonsillectomy 3 days ago.  She is able to tolerate secretions.  No shortness of breath, stridor, drooling, neck stiffness.  Symptoms have improved since receiving Toradol.  She has been hydrated with IV fluids given reported  decrease in oral intake due to pain.  Labs initiated in triage and reviewed.  They are reassuring.  I recommended that she follow-up with her ENT.  Return precautions discussed and provided. Patient discharged in stable condition with no unaddressed concerns.   Final Clinical Impression(s) / ED Diagnoses Final diagnoses:  Dysphagia, unspecified type    Rx / DC Orders ED Discharge Orders    None       Antony Madura, PA-C 11/19/20 [id number]    Cheryll Cockayne, MD 11/19/20 [phone]

## 2020-11-20 ENCOUNTER — Telehealth: Payer: Self-pay | Admitting: *Deleted

## 2020-11-20 NOTE — Telephone Encounter (Signed)
Transition Care Management Follow-up Telephone Call  Date of discharge and from where: 11/19/2020 - [facility]  How have you been since you were released from the hospital? "About the same"  Any questions or concerns? No  Items Reviewed:  Did the pt receive and understand the discharge instructions provided? Yes   Medications obtained and verified? Yes   Other? No   Any new allergies since your discharge? No   Dietary orders reviewed? Yes  Do you have support at home? Yes   Home Care and Equipment/Supplies: Were home health services ordered? not applicable If so, what is the name of the agency? N/A  Has the agency set up a time to come to the patient's home? not applicable Were any new equipment or medical supplies ordered?  No What is the name of the medical supply agency? N/A Were you able to get the supplies/equipment? not applicable Do you have any questions related to the use of the equipment or supplies? No  Functional Questionnaire: (I = Independent and D = Dependent) ADLs: I  Bathing/Dressing- I  Meal Prep- I  Eating- I  Maintaining continence- I  Transferring/Ambulation- I  Managing Meds- I  Follow up appointments reviewed:   PCP Hospital f/u appt confirmed? No    Specialist Hospital f/u appt confirmed? No    Are transportation arrangements needed? No   If their condition worsens, is the pt aware to call PCP or go to the Emergency Dept.? Yes  Was the patient provided with contact information for the PCP's office or ED? Yes  Was to pt encouraged to call back with questions or concerns? Yes

## 2020-12-26 ENCOUNTER — Encounter: Payer: Self-pay | Admitting: Family Medicine

## 2020-12-27 NOTE — Telephone Encounter (Signed)
Please see message . Thank you .

## 2021-01-29 ENCOUNTER — Ambulatory Visit (HOSPITAL_COMMUNITY)
Admission: EM | Admit: 2021-01-29 | Discharge: 2021-01-29 | Disposition: A | Discharge status: Home / Self Care | Payer: Medicaid Other | Attending: Family Medicine | Admitting: Family Medicine

## 2021-01-29 ENCOUNTER — Encounter (HOSPITAL_COMMUNITY): Payer: Self-pay

## 2021-01-29 ENCOUNTER — Other Ambulatory Visit: Payer: Self-pay

## 2021-01-29 DIAGNOSIS — H8110 Benign paroxysmal vertigo, unspecified ear: Secondary | ICD-10-CM

## 2021-01-29 DIAGNOSIS — R1032 Left lower quadrant pain: Secondary | ICD-10-CM

## 2021-01-29 DIAGNOSIS — R110 Nausea: Secondary | ICD-10-CM | POA: Diagnosis not present

## 2021-01-29 DIAGNOSIS — R1031 Right lower quadrant pain: Secondary | ICD-10-CM

## 2021-01-29 LAB — POCT URINALYSIS DIPSTICK, ED / UC
Bilirubin Urine: NEGATIVE
Glucose, UA: NEGATIVE mg/dL
Hgb urine dipstick: NEGATIVE
Ketones, ur: NEGATIVE mg/dL
Leukocytes,Ua: NEGATIVE
Nitrite: NEGATIVE
Protein, ur: NEGATIVE mg/dL
Specific Gravity, Urine: 1.02 (ref 1.005–1.030)
Urobilinogen, UA: 0.2 mg/dL (ref 0.0–1.0)
pH: 6.5 (ref 5.0–8.0)

## 2021-01-29 LAB — CBG MONITORING, ED: Glucose-Capillary: 77 mg/dL (ref 70–99)

## 2021-01-29 LAB — POC URINE PREG, ED: Preg Test, Ur: NEGATIVE

## 2021-01-29 MED ORDER — MECLIZINE HCL 25 MG PO TABS: 25.0000 mg | ORAL_TABLET | Freq: Three times a day (TID) | ORAL | 0 refills | Status: DC | PRN

## 2021-01-29 MED ORDER — ONDANSETRON 4 MG PO TBDP: 4.0000 mg | ORAL_TABLET | Freq: Three times a day (TID) | ORAL | 0 refills | Status: DC | PRN

## 2021-01-29 NOTE — Discharge Instructions (Signed)

## 2021-01-29 NOTE — ED Triage Notes (Signed)
Pt c/o dizziness, stomach pain, fatigue X 2 weeks. She states the dizziness has gotten worse since this morning and states she has felt nauseas.

## 2021-01-30 NOTE — ED Provider Notes (Signed)
[facility]   [id number] 01/29/21 Arrival Time: 1452  ASSESSMENT & PLAN:  1. Benign paroxysmal positional vertigo, unspecified laterality   2. Bilateral lower abdominal discomfort   3. Nausea without vomiting    Normal neurologic exam. No suspicion for ICH or SAH. No indication for neurodiagnostic imaging at this time. Benign abdominal exam. VSS. She is comfortable with home observation.  Meds ordered this encounter  Medications  . meclizine (ANTIVERT) 25 MG tablet    Sig: Take 1 tablet (25 mg total) by mouth 3 (three) times daily as needed for dizziness.    Dispense:  30 tablet    Refill:  0  . ondansetron (ZOFRAN-ODT) 4 MG disintegrating tablet    Sig: Take 1 tablet (4 mg total) by mouth every 8 (eight) hours as needed for nausea or vomiting.    Dispense:  15 tablet    Refill:  0    Agrees to proceed to the ED if she develops other symptoms such as alterations of speech, swallowing, vision, motor/sensory systems, or if dizziness worsens.   Discharge Instructions     You have been seen today for abdominal pain. Your evaluation was not suggestive of any emergent condition requiring medical intervention at this time. However, some abdominal problems make take more time to appear. Therefore, it is very important for you to pay attention to any new symptoms or worsening of your current condition.  Please return here or to the Emergency Department immediately should you begin to feel worse in any way or have any of the following symptoms: increasing or different abdominal pain, persistent vomiting, inability to drink fluids, fevers, or shaking chills.       Labs Reviewed  POCT URINALYSIS DIPSTICK, ED / UC  POC URINE PREG, ED  CBG MONITORING, ED   All normal/negative.   Follow-up Information    [facility] EMERGENCY DEPARTMENT.   Specialty: Emergency Medicine Why: If your abdominal pain or vertigo worsens in any way. Contact information: [address] [id number] mc [place] [address] [phone]              Reviewed expectations re: course of current medical issues. Questions answered. Outlined signs and symptoms indicating need for more acute intervention. Patient verbalized understanding. After Visit Summary given.   SUBJECTIVE:  Donna Singh is a 25 y.o. female who reports gradual onset of dizziness described as vertigo. Current symptoms first noted over past couple of weeks and have progressed to a point and plateaued. Experiencing symptoms randomly with episodes typically lasting a few minutes; better when she is still; worse with certain head movements. Aggravating factors: head movements and bending. Denies coordination problems, gait problems, headaches, paresthesia, speech problems and weakness as well as otalgia and tinnitus. Recent infections: none. Head trauma: denied. Noise exposure: none. No associated SOB, CP, or palpatations reported. Recent travel: none. Reports normal bowel/bladder habits. Previous workup/treatments: none. H/O similar: no. Therapies tried thus far: none. Mild nausea without emesis.  Social History   Substance and Sexual Activity  Alcohol Use No   Social History   Tobacco Use  Smoking Status Never Smoker  Smokeless Tobacco Never Used   Denies street drug use.   OBJECTIVE:  Vitals:   01/29/21 1639  BP: 115/62  Pulse: 62  Resp: 20  Temp: 98.5 F (36.9 C)  TempSrc: Oral  SpO2: 100%    General appearance: alert; no distress Eyes: PERRLA; EOMI; conjunctiva normal HENT: normocephalic; atraumatic; TMs normal; nasal mucosa normal;  oral mucosa normal Neck: supple with FROM Lungs: clear to auscultation bilaterally; unlabored Heart: regular rate and rhythm Abdomen: soft, non-tender; bowel sounds normal Extremities: no cyanosis or edema; symmetrical with no gross deformities Skin: warm and dry Neurologic: normal gait; DTR's normal and symmetric; CN  2-12 grossly intact; rapid changes in position during the exam do precipitate brief dizziness Psychological: alert and cooperative; normal mood and affect  Investigations: Results for orders placed or performed during the hospital encounter of 01/29/21  POC Urinalysis dipstick  Result Value Ref Range   Glucose, UA NEGATIVE NEGATIVE mg/dL   Bilirubin Urine NEGATIVE NEGATIVE   Ketones, ur NEGATIVE NEGATIVE mg/dL   Specific Gravity, Urine 1.020 1.005 - 1.030   Hgb urine dipstick NEGATIVE NEGATIVE   pH 6.5 5.0 - 8.0   Protein, ur NEGATIVE NEGATIVE mg/dL   Urobilinogen, UA 0.2 0.0 - 1.0 mg/dL   Nitrite NEGATIVE NEGATIVE   Leukocytes,Ua NEGATIVE NEGATIVE  POC urine pregnancy  Result Value Ref Range   Preg Test, Ur NEGATIVE NEGATIVE  POC CBG monitoring  Result Value Ref Range   Glucose-Capillary 77 70 - 99 mg/dL   Labs Reviewed  POCT URINALYSIS DIPSTICK, ED / UC  POC URINE PREG, ED  CBG MONITORING, ED   No results found.  No Known Allergies  Past Medical History:  Diagnosis Date  . Medical history non-contributory    Social History   Socioeconomic History  . Marital status: Significant Other    Spouse name: Not on file  . Number of children: Not on file  . Years of education: Not on file  . Highest education level: Not on file  Occupational History  . Not on file  Tobacco Use  . Smoking status: Never Smoker  . Smokeless tobacco: Never Used  Vaping Use  . Vaping Use: Never used  Substance and Sexual Activity  . Alcohol use: No  . Drug use: No  . Sexual activity: Never  Other Topics Concern  . 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    Social Determinants of Health   Financial Resource Strain: Not on file  Food Insecurity: Not on file  Transportation Needs: Not on file  Physical Activity: Not on file  Stress: Not on file  Social Connections: Not on file  Intimate Partner Violence: Not on file   History reviewed. No  pertinent family history. Past Surgical History:  Procedure Laterality Date  . CESAREAN SECTION N/A 07/29/2017   Procedure: CESAREAN SECTION;  Surgeon: Kathrynn Running, MD;  Location: [facility] BIRTHING SUITES;  Service: Obstetrics;  Laterality: N/A;  . Greig Right, MD 01/30/21 [phone]

## 2021-02-08 ENCOUNTER — Other Ambulatory Visit: Payer: Self-pay

## 2021-02-08 ENCOUNTER — Emergency Department (HOSPITAL_COMMUNITY): Payer: Medicaid Other

## 2021-02-08 ENCOUNTER — Encounter: Payer: Self-pay | Admitting: Family Medicine

## 2021-02-08 ENCOUNTER — Encounter (HOSPITAL_COMMUNITY): Payer: Self-pay | Admitting: Emergency Medicine

## 2021-02-08 ENCOUNTER — Emergency Department (HOSPITAL_COMMUNITY)
Admission: EM | Admit: 2021-02-08 | Discharge: 2021-02-08 | Disposition: A | Discharge status: Home / Self Care | Payer: Medicaid Other | Attending: Emergency Medicine | Admitting: Emergency Medicine

## 2021-02-08 DIAGNOSIS — R1031 Right lower quadrant pain: Secondary | ICD-10-CM | POA: Insufficient documentation

## 2021-02-08 DIAGNOSIS — D509 Iron deficiency anemia, unspecified: Secondary | ICD-10-CM | POA: Insufficient documentation

## 2021-02-08 DIAGNOSIS — R109 Unspecified abdominal pain: Secondary | ICD-10-CM

## 2021-02-08 LAB — COMPREHENSIVE METABOLIC PANEL
ALT: 16 U/L (ref 0–44)
AST: 25 U/L (ref 15–41)
Albumin: 3.6 g/dL (ref 3.5–5.0)
Alkaline Phosphatase: 67 U/L (ref 38–126)
Anion gap: 6 (ref 5–15)
BUN: 7 mg/dL (ref 6–20)
CO2: 26 mmol/L (ref 22–32)
Calcium: 9.3 mg/dL (ref 8.9–10.3)
Chloride: 105 mmol/L (ref 98–111)
Creatinine, Ser: 0.75 mg/dL (ref 0.44–1.00)
GFR, Estimated: >60 mL/min (ref >60)
Glucose, Bld: 89 mg/dL (ref 70–99)
Potassium: 3.5 mmol/L (ref 3.5–5.1)
Sodium: 137 mmol/L (ref 135–145)
Total Bilirubin: 0.5 mg/dL (ref 0.3–1.2)
Total Protein: 6.8 g/dL (ref 6.5–8.1)

## 2021-02-08 LAB — URINALYSIS, ROUTINE W REFLEX MICROSCOPIC
Bilirubin Urine: NEGATIVE
Glucose, UA: NEGATIVE mg/dL
Hgb urine dipstick: NEGATIVE
Ketones, ur: NEGATIVE mg/dL
Leukocytes,Ua: NEGATIVE
Nitrite: NEGATIVE
Protein, ur: NEGATIVE mg/dL
Specific Gravity, Urine: 1.01 (ref 1.005–1.030)
pH: 6 (ref 5.0–8.0)

## 2021-02-08 LAB — CBC
HCT: 34.4 % — ABNORMAL LOW (ref 36.0–46.0)
Hemoglobin: 11.3 g/dL — ABNORMAL LOW (ref 12.0–15.0)
MCH: 25.6 pg — ABNORMAL LOW (ref 26.0–34.0)
MCHC: 32.8 g/dL (ref 30.0–36.0)
MCV: 78 fL — ABNORMAL LOW (ref 80.0–100.0)
Platelets: 345 10*3/uL (ref 150–400)
RBC: 4.41 MIL/uL (ref 3.87–5.11)
RDW: 12.8 % (ref 11.5–15.5)
WBC: 7.9 10*3/uL (ref 4.0–10.5)
nRBC: 0 % (ref 0.0–0.2)

## 2021-02-08 LAB — LIPASE, BLOOD: Lipase: 32 U/L (ref 11–51)

## 2021-02-08 LAB — I-STAT BETA HCG BLOOD, ED (MC, WL, AP ONLY): I-stat hCG, quantitative: <5 m[IU]/mL (ref <5)

## 2021-02-08 MED ORDER — KETOROLAC TROMETHAMINE 60 MG/2ML IM SOLN
60.0000 mg | Freq: Once | INTRAMUSCULAR | Status: AC
  Administered 2021-02-08: 60 mg via INTRAMUSCULAR
  Filled 2021-02-08: qty 2

## 2021-02-08 NOTE — ED Notes (Signed)
Patient given discharge instructions. Questions were answered. Patient verbalized understanding of discharge instructions and care at home.  

## 2021-02-08 NOTE — Discharge Instructions (Signed)
Your evaluation did not show the cause for your abdominal pain, but there was not any sign of anything serious.  However, if your pain is getting worse, please return so we can reevaluate it.  Please take ibuprofen plus acetaminophen as needed for pain.  Taking both medications together gives you better pain relief than taking either one alone.

## 2021-02-08 NOTE — ED Triage Notes (Signed)
Pt reports lower right sided abd pain that comes and goes X3 weeks.  No other symptoms and is unable to identify common factors when the pain returns.

## 2021-02-08 NOTE — ED Provider Notes (Signed)
[facility] EMERGENCY DEPARTMENT Provider Note   CSN: [id number] Arrival date & time: 02/08/21  0216     History Chief Complaint  Patient presents with  . Abdominal Pain    Donna Singh is a 25 y.o. female.  The history is provided by the patient.  Abdominal Pain She has history of sickle cell trait and comes in because of pain in the right side of her abdomen intermittently over the last 3 weeks.  Pain is crampy and she rates it at 8/10.  There is no associated nausea, vomiting, diarrhea.  She denies urinary urgency, frequency, tenesmus, dysuria.  She denies any vaginal discharge.  Pain does not radiate to the back or chest or shoulder.  It is not affected by eating or body position or bowel movements or urination.  She has tried taking ibuprofen and acetaminophen for pain, but without relief.  When present, pain usually lasts about 1 hour before resolving.  She has Nexplanon implant for contraception.   Past Medical History:  Diagnosis Date  . Medical history non-contributory     Patient Active Problem List   Diagnosis Date Noted  . Indication for care in labor or delivery 07/29/2017  . Sickle cell trait (HCC) 07/29/2017  . Positive GBS test 07/29/2017  . Fetal heart rate deceleration, delivered, current hospitalization 07/29/2017  . Status post primary low transverse cesarean section 07/29/2017    Past Surgical History:  Procedure Laterality Date  . CESAREAN SECTION N/A 07/29/2017   Procedure: CESAREAN SECTION;  Surgeon: Kathrynn Running, MD;  Location: [facility] BIRTHING SUITES;  Service: Obstetrics;  Laterality: N/A;  . TONSILLECTOMY       OB History    Gravida  1   Para      Term      Preterm      AB      Living        SAB      IAB      Ectopic      Multiple      Live Births  0           No family history on file.  Social History   Tobacco Use  . Smoking status: Never Smoker  . Smokeless tobacco: Never Used  Vaping Use   . Vaping Use: Never used  Substance Use Topics  . Alcohol use: No  . Drug use: No    Home Medications Prior to Admission medications   Medication Sig Start Date End Date Taking? Authorizing Provider  HYDROcodone-acetaminophen (HYCET) 7.5-325 mg/15 ml solution Take 15 mLs by mouth every 4 (four) hours as needed for moderate pain or severe pain. 11/15/20   [provider]  meclizine (ANTIVERT) 25 MG tablet Take 1 tablet (25 mg total) by mouth 3 (three) times daily as needed for dizziness. 01/29/21   Mardella Layman, MD  norgestimate-ethinyl estradiol (ORTHO-CYCLEN) 0.25-35 MG-MCG tablet Take 1 tablet by mouth daily. Patient not taking: Reported on 11/19/2020 07/23/20   Levie Heritage, DO  ondansetron (ZOFRAN-ODT) 4 MG disintegrating tablet Take 1 tablet (4 mg total) by mouth every 8 (eight) hours as needed for nausea or vomiting. 01/29/21   Mardella Layman, MD    Allergies    Patient has no known allergies.  Review of Systems   Review of Systems  Gastrointestinal: Positive for abdominal pain.  All other systems reviewed and are negative.   Physical Exam Updated Vital Signs BP 116/71 (BP Location: Left Arm)  Pulse 66   Temp 98.3 F (36.8 C) (Oral)   Resp 15   Ht 5\' 3"  (1.6 m)   Wt 85 kg   LMP 01/22/2021 (Exact Date)   SpO2 100%   BMI 33.19 kg/m   Physical Exam Vitals and nursing note reviewed.   25 year old female, resting comfortably and in no acute distress. Vital signs are normal. Oxygen saturation is 100%, which is normal. Head is normocephalic and atraumatic. PERRLA, EOMI. Oropharynx is clear. Neck is nontender and supple without adenopathy or JVD. Back is nontender and there is no CVA tenderness. Lungs are clear without rales, wheezes, or rhonchi. Chest is nontender. Heart has regular rate and rhythm without murmur. Abdomen is soft, flat, with mild tenderness in the right lower quadrant.  There is no rebound or guarding.  There are no masses or  hepatosplenomegaly and peristalsis is hypoactive. Extremities have no cyanosis or edema, full range of motion is present. Skin is warm and dry without rash. Neurologic: Mental status is normal, cranial nerves are intact, there are no motor or sensory deficits.  ED Results / Procedures / Treatments   Labs (all labs ordered are listed, but only abnormal results are displayed) Labs Reviewed  CBC - Abnormal; Notable for the following components:      Result Value   Hemoglobin 11.3 (*)    HCT 34.4 (*)    MCV 78.0 (*)    MCH 25.6 (*)    All other components within normal limits  LIPASE, BLOOD  COMPREHENSIVE METABOLIC PANEL  URINALYSIS, ROUTINE W REFLEX MICROSCOPIC  I-STAT BETA HCG BLOOD, ED ([facility], [facility], [facility] ONLY)   Radiology CT ABDOMEN PELVIS WO CONTRAST  Result Date: 02/08/2021 CLINICAL DATA:  Right lower quadrant pain. EXAM: CT ABDOMEN AND PELVIS WITHOUT CONTRAST TECHNIQUE: Multidetector CT imaging of the abdomen and pelvis was performed following the standard protocol without IV contrast. COMPARISON:  None. FINDINGS: Lower chest: No acute abnormality. Hepatobiliary: No focal liver abnormality. The gallbladder is contracted. No gallstones, gallbladder wall thickening, or pericholecystic fluid. No biliary dilatation. Pancreas: No focal lesion. Normal pancreatic contour. No surrounding inflammatory changes. No main pancreatic ductal dilatation. Spleen: Normal in size without focal abnormality. Adrenals/Urinary Tract: No adrenal nodule bilaterally. No nephrolithiasis, no hydronephrosis, and no contour-deforming renal mass. No ureterolithiasis or hydroureter. The urinary bladder is unremarkable. Stomach/Bowel: Stomach is within normal limits. No evidence of bowel wall thickening or dilatation. Appendix appears normal. Vascular/Lymphatic: No significant vascular findings are present. No enlarged abdominal or pelvic lymph nodes. Reproductive: Uterus and bilateral adnexa are unremarkable. Other: No  intraperitoneal free fluid. No intraperitoneal free gas. No organized fluid collection. Musculoskeletal: Tiny fat containing umbilical hernia. No acute or significant osseous findings. IMPRESSION: 1. Normal appendix. 2. No acute intra-abdominal or intrapelvic abnormality on this noncontrast study. Electronically Signed   By: 02/10/2021 M.D.   On: 02/08/2021 03:49    Procedures Procedures   Medications Ordered in ED Medications  ketorolac (TORADOL) injection 60 mg (60 mg Intramuscular Given 02/08/21 0420)    ED Course  I have reviewed the triage vital signs and the nursing notes.  Pertinent labs & imaging results that were available during my care of the patient were reviewed by me and considered in my medical decision making (see chart for details).   MDM Rules/Calculators/A&P                         Abdominal pain which appears benign.  However  she does have tenderness which is fairly well localized to McBurney's area, will send for CT of abdomen and pelvis.  Labs show normal WBC, mild anemia which is unchanged from baseline.  Old records are reviewed, and she has no relevant past visits.  CT scan shows no acute process.  She did get good pain relief from ketorolac.  I have explained to the patient that she should be able to get similar relief by taking ibuprofen at home.  However, the cause for her pain is not clear.  She is referred to gastroenterology for further outpatient work-up.  Return precautions discussed.  Final Clinical Impression(s) / ED Diagnoses Final diagnoses:  Abdominal pain, unspecified abdominal location  Microcytic anemia    Rx / DC Orders ED Discharge Orders    None       Dione Booze, MD 02/08/21 [phone]

## 2021-02-15 ENCOUNTER — Ambulatory Visit: Payer: Medicaid Other | Admitting: Family Medicine

## 2021-03-20 ENCOUNTER — Ambulatory Visit: Payer: Medicaid Other | Admitting: Obstetrics & Gynecology

## 2021-06-12 ENCOUNTER — Emergency Department (HOSPITAL_COMMUNITY): Payer: Medicaid Other

## 2021-06-12 ENCOUNTER — Emergency Department (HOSPITAL_COMMUNITY)
Admission: EM | Admit: 2021-06-12 | Discharge: 2021-06-12 | Disposition: A | Discharge status: Home / Self Care | Payer: Medicaid Other | Attending: Emergency Medicine | Admitting: Emergency Medicine

## 2021-06-12 ENCOUNTER — Encounter (HOSPITAL_COMMUNITY): Payer: Self-pay | Admitting: *Deleted

## 2021-06-12 DIAGNOSIS — Y92410 Unspecified street and highway as the place of occurrence of the external cause: Secondary | ICD-10-CM | POA: Insufficient documentation

## 2021-06-12 DIAGNOSIS — M25532 Pain in left wrist: Secondary | ICD-10-CM | POA: Diagnosis not present

## 2021-06-12 DIAGNOSIS — M25562 Pain in left knee: Secondary | ICD-10-CM | POA: Insufficient documentation

## 2021-06-12 MED ORDER — ACETAMINOPHEN 325 MG PO TABS
650.0000 mg | ORAL_TABLET | Freq: Once | ORAL | Status: AC
  Administered 2021-06-12: 650 mg via ORAL
  Filled 2021-06-12: qty 2

## 2021-06-12 MED ORDER — IBUPROFEN 200 MG PO TABS
600.0000 mg | ORAL_TABLET | Freq: Once | ORAL | Status: AC
  Administered 2021-06-12: 600 mg via ORAL
  Filled 2021-06-12: qty 3
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
## 2021-06-12 NOTE — Discharge Instructions (Addendum)
Continue taking Tylenol and Motrin as needed for pain.  Return immediately to the ER if you have new symptoms worsening pain fevers or any additional concerns.
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
## 2021-06-14 ENCOUNTER — Ambulatory Visit: Payer: Medicaid Other | Admitting: Family Medicine

## 2021-06-24 ENCOUNTER — Ambulatory Visit: Payer: Medicaid Other | Admitting: Neurology

## 2021-06-24 ENCOUNTER — Encounter: Payer: Self-pay | Admitting: Neurology

## 2021-06-24 ENCOUNTER — Other Ambulatory Visit: Payer: Self-pay

## 2021-06-24 DIAGNOSIS — M542 Cervicalgia: Secondary | ICD-10-CM | POA: Diagnosis not present

## 2021-06-24 DIAGNOSIS — R519 Headache, unspecified: Secondary | ICD-10-CM | POA: Diagnosis not present

## 2021-06-24 DIAGNOSIS — H5712 Ocular pain, left eye: Secondary | ICD-10-CM | POA: Diagnosis not present

## 2021-06-24 MED ORDER — ETODOLAC 400 MG PO TABS: 400.0000 mg | ORAL_TABLET | Freq: Two times a day (BID) | ORAL | 0 refills | Status: DC
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
## 2021-06-25 ENCOUNTER — Telehealth: Payer: Self-pay | Admitting: *Deleted

## 2021-06-25 ENCOUNTER — Ambulatory Visit: Payer: Medicaid Other | Admitting: Family Medicine

## 2021-06-25 NOTE — Telephone Encounter (Signed)
Called pt because we received fax that etodolac not covered. Formulary options: naproxen, ibuprofen, meloxicam, ECnaproxent, sulindac. Pt states she paid out of pocket for medication and picked up. She will take etodolac. If ineffective, she will call back. Nothing further needed.

## 2021-08-21 ENCOUNTER — Other Ambulatory Visit: Payer: Self-pay | Admitting: Neurology

## 2021-09-24 ENCOUNTER — Encounter: Payer: Medicaid Other | Admitting: Family Medicine
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
## 2021-10-28 ENCOUNTER — Encounter: Payer: Self-pay | Admitting: Nurse Practitioner

## 2021-10-28 ENCOUNTER — Other Ambulatory Visit: Payer: Self-pay

## 2021-10-28 ENCOUNTER — Ambulatory Visit (INDEPENDENT_AMBULATORY_CARE_PROVIDER_SITE_OTHER): Payer: Medicaid Other | Admitting: Nurse Practitioner

## 2021-10-28 VITALS — BP 98/80 | HR 60 | Temp 96.4°F | Wt 180.4 lb

## 2021-10-28 DIAGNOSIS — R5383 Other fatigue: Secondary | ICD-10-CM | POA: Diagnosis not present

## 2021-10-28 DIAGNOSIS — K5900 Constipation, unspecified: Secondary | ICD-10-CM

## 2021-10-28 DIAGNOSIS — D509 Iron deficiency anemia, unspecified: Secondary | ICD-10-CM

## 2021-10-28 DIAGNOSIS — Z1159 Encounter for screening for other viral diseases: Secondary | ICD-10-CM

## 2021-10-28 DIAGNOSIS — K649 Unspecified hemorrhoids: Secondary | ICD-10-CM | POA: Diagnosis not present

## 2021-10-28 NOTE — Assessment & Plan Note (Signed)
Chronic, ongoing since the birth of her first child 10 years ago. Discussed increasing fiber in her diet and making sure she is drinking plenty of water. Will place referral to GI per patient request.

## 2021-10-28 NOTE — Assessment & Plan Note (Signed)
Will check CBC and iron panel today. She has not been able to tolerate iron supplement daily due to constipation. Discussed that she can take the iron supplement every other day or twice a week. Depending on results and if unable to tolerate PO iron, may need a referral to hematology in the future for iron infusions.

## 2021-10-28 NOTE — Patient Instructions (Addendum)
It was great to see you!  Start taking over the counter fiber supplement daily such as metamucil, benefiber. These can come in powder to mix with drinks, crackers, and gummies.   If your hemorrhoid starts to flare up getting swollen or bleeding, you can use preparation H and warm baths. I have placed a referral to GI and you should get a phone call to schedule the appointment.  We are going to check your labs and your blood counts today.  Let's follow-up in 3 months, sooner if you have concerns.  If a referral was placed today, you will be contacted for an appointment. Please note that routine referrals can sometimes take up to 3-4 weeks to process. Please call our office if you haven't heard anything after this time frame.  Take care,  Rodman Pickle, NP

## 2021-10-28 NOTE — Assessment & Plan Note (Signed)
Noted on exam. Not currently inflammed.Discused preparation H OTC or can treat with annusol suppository if symptoms return. Discussed sitz baths as needed. With ongoing constipation and per request, GI referral placed.

## 2021-10-29 LAB — CBC WITH DIFFERENTIAL/PLATELET
Basophils Absolute: 0 10*3/uL (ref 0.0–0.1)
Basophils Relative: 0.6 % (ref 0.0–3.0)
Eosinophils Absolute: 0.1 10*3/uL (ref 0.0–0.7)
Eosinophils Relative: 1.8 % (ref 0.0–5.0)
HCT: 36.9 % (ref 36.0–46.0)
Hemoglobin: 11.9 g/dL — ABNORMAL LOW (ref 12.0–15.0)
Lymphocytes Relative: 46.1 % — ABNORMAL HIGH (ref 12.0–46.0)
Lymphs Abs: 2.2 10*3/uL (ref 0.7–4.0)
MCHC: 32.1 g/dL (ref 30.0–36.0)
MCV: 76.8 fl — ABNORMAL LOW (ref 78.0–100.0)
Monocytes Absolute: 0.3 10*3/uL (ref 0.1–1.0)
Monocytes Relative: 7.2 % (ref 3.0–12.0)
Neutro Abs: 2.1 10*3/uL (ref 1.4–7.7)
Neutrophils Relative %: 44.3 % (ref 43.0–77.0)
Platelets: 336 10*3/uL (ref 150.0–400.0)
RBC: 4.81 Mil/uL (ref 3.87–5.11)
RDW: 14.2 % (ref 11.5–15.5)
WBC: 4.8 10*3/uL (ref 4.0–10.5)

## 2021-10-29 LAB — HEPATITIS C ANTIBODY
Hepatitis C Ab: NONREACTIVE
SIGNAL TO CUT-OFF: 0.09 (ref <1.00)

## 2021-10-29 LAB — THYROID PANEL WITH TSH
Free Thyroxine Index: 2.6 (ref 1.4–3.8)
T3 Uptake: 30 % (ref 22–35)
T4, Total: 8.7 ug/dL (ref 5.1–11.9)
TSH: 1.77 mIU/L

## 2021-10-29 LAB — IRON,TIBC AND FERRITIN PANEL
%SAT: 28 % (calc) (ref 16–45)
Ferritin: 37 ng/mL (ref 16–154)
Iron: 93 ug/dL (ref 40–190)
TIBC: 330 mcg/dL (calc) (ref 250–450)

## 2021-10-30 LAB — COMPREHENSIVE METABOLIC PANEL
ALT: 13 U/L (ref 0–35)
AST: 21 U/L (ref 0–37)
Albumin: 4.5 g/dL (ref 3.5–5.2)
Alkaline Phosphatase: 64 U/L (ref 39–117)
BUN: 10 mg/dL (ref 6–23)
CO2: 29 mEq/L (ref 19–32)
Calcium: 10.1 mg/dL (ref 8.4–10.5)
Chloride: 102 mEq/L (ref 96–112)
Creatinine, Ser: 0.79 mg/dL (ref 0.40–1.20)
GFR: 104.07 mL/min (ref >60.00)
Glucose, Bld: 72 mg/dL (ref 70–99)
Potassium: 4 mEq/L (ref 3.5–5.1)
Sodium: 136 mEq/L (ref 135–145)
Total Bilirubin: 0.5 mg/dL (ref 0.2–1.2)
Total Protein: 8.2 g/dL (ref 6.0–8.3)

## 2021-10-31 ENCOUNTER — Encounter: Payer: Self-pay | Admitting: Nurse Practitioner

## 2021-11-07 ENCOUNTER — Encounter: Payer: Self-pay | Admitting: Nurse Practitioner

## 2021-11-07 DIAGNOSIS — K649 Unspecified hemorrhoids: Secondary | ICD-10-CM

## 2021-11-13 ENCOUNTER — Encounter: Payer: Self-pay | Admitting: Nurse Practitioner

## 2021-11-14 ENCOUNTER — Ambulatory Visit: Payer: Medicaid Other | Admitting: Internal Medicine

## 2021-11-14 ENCOUNTER — Encounter: Payer: Self-pay | Admitting: Internal Medicine

## 2021-11-14 VITALS — BP 113/70 | HR 72 | Ht 64.0 in | Wt 184.0 lb

## 2021-11-14 DIAGNOSIS — K649 Unspecified hemorrhoids: Secondary | ICD-10-CM | POA: Diagnosis not present

## 2021-11-14 DIAGNOSIS — K5900 Constipation, unspecified: Secondary | ICD-10-CM | POA: Diagnosis not present

## 2021-11-14 NOTE — Progress Notes (Signed)
HISTORY OF PRESENT ILLNESS: ? 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  She reports a 1 year history of constipation.  She describes straining with bowel movements.  This resulted in rectal discomfort and swollen hemorrhoids.  When she has a good bowel movement her abdominal complaints improved.  She did try MiraLAX which helped, but she was apprehensive about using this regularly.  She denies weight loss or significant rectal bleeding.  No family history of colon cancer. ? ?REVIEW OF SYSTEMS: ? ?All non-GI ROS negative unless otherwise stated in the HPI except for back pain ? ?Past Medical History:  ?Diagnosis Date  ? Anemia   ? ? ?Past Surgical History:  ?Procedure Laterality Date  ? CESAREAN SECTION N/A 07/29/2017  ? Procedure: CESAREAN SECTION;  Surgeon: Kathrynn Running, MD;  Location: [facility]IRTHING SUIT[facility];  Service: Obstetrics;  Laterality: N/A;  ? TONSILLECTOMY    ? ? ?Social History ?[facility]  reports that she has never smoked. She has never used smokeless tobacco. She reports that she does not drink alcohol and does not use drugs. ? ?family history is not on file. ? ?Allergies  ?Allergen Reactions  ? Flulaval Quadrivalent [Influenza Vac Split Quad] Nausea And Vomiting  ? ? ?  ? ?PHYSICAL EXAMINATION: ?Vital signs: BP 113/70   Pulse 72   Ht 5\' 4"  (1.626 m)   Wt 184 lb (83.5 kg)   LMP 10/26/2021 (Exact Date)   SpO2 95%   BMI 31.58 kg/m?   ?Constitutional: generally well-appearing, no acute distress ?Psychiatric: alert and oriented x3, cooperative ?Eyes: extraocular movements intact, anicteric, conjunctiva pink ?Mouth: oral pharynx moist, no lesions ?Neck: supple no lymphadenopathy ?Cardiovascular: heart regular rate and rhythm, no murmur ?Lungs: clear to auscultation bilaterally ?Abdomen: soft, nontender, nondistended, no obvious ascites, no peritoneal signs, normal bowel sounds, no organomegaly ?Rectal: Omitted ?Extremities: no  clubbing, cyanosis, or lower extremity edema bilaterally ?Skin: no lesions on visible extremities ?Neuro: No focal deficits.  Cranial nerves intact ? ?ASSESSMENT: ? ?1.  Constipation ?2.  Abdominal bloating ?3.  Hemorrhoidal discomfort ? ? ?PLAN: ? ?1.  Recommend MiraLAX daily.  Titrate to need ?2.  RectiCare samples provided for hemorrhoidal discomfort ?3.  Follow-up as needed for refractory or recurrent problems. ? ? ? ? ?  ?

## 2021-11-14 NOTE — Patient Instructions (Signed)
If you are age 26 or older, your body mass index should be between 23-30. Your Body mass index is 31.58 kg/m?Marland Kitchen If this is out of the aforementioned range listed, please consider follow up with your Primary Care Provider. ? ?If you are age 103 or younger, your body mass index should be between 19-25. Your Body mass index is 31.58 kg/m?Marland Kitchen If this is out of the aformentioned range listed, please consider follow up with your Primary Care Provider.  ? ?________________________________________________________ ? ?The [facility] providers would like to encourage you to use [facility] to communicate with providers for non-urgent requests or questions.  Due to long hold times on the telephone, sending your provider a message by [facility] may be a faster and more efficient way to get a response.  Please allow 48 business hours for a response.  Please remember that this is for non-urgent requests.  ?_______________________________________________________ ? ?Take Miralax daily. ? ?Use Recticare once or twice daily ?

## 2021-11-14 NOTE — Telephone Encounter (Signed)
Please review and advise. Thanks. Dm/cma  

## 2021-11-14 NOTE — Addendum Note (Signed)
Addended by: Rodman Pickle A on: 11/14/2021 03:39 PM ? ? Modules accepted: Orders ? ?

## 2021-11-26 ENCOUNTER — Encounter: Payer: Self-pay | Admitting: Nurse Practitioner

## 2021-11-27 ENCOUNTER — Ambulatory Visit (INDEPENDENT_AMBULATORY_CARE_PROVIDER_SITE_OTHER): Payer: Medicaid Other

## 2021-11-27 ENCOUNTER — Ambulatory Visit: Payer: Medicaid Other

## 2021-11-27 ENCOUNTER — Other Ambulatory Visit: Payer: Self-pay

## 2021-11-27 DIAGNOSIS — Z0000 Encounter for general adult medical examination without abnormal findings: Secondary | ICD-10-CM

## 2021-11-27 DIAGNOSIS — Z111 Encounter for screening for respiratory tuberculosis: Secondary | ICD-10-CM | POA: Diagnosis not present

## 2021-11-27 NOTE — Progress Notes (Signed)
PPD Placement note ?Donna Singh, 26 y.o. female is here today for placement of PPD test ?Reason for PPD test: work ?Pt taken PPD test before: yes ?Verified in allergy area and with patient that they are not allergic to the products PPD is made of (Phenol or Tween). Yes ?Is patient taking any oral or IV steroid medication now or have they taken it in the last month? no ?Has the patient ever received the BCG vaccine?: no ?Has the patient been in recent contact with anyone known or suspected of having active TB disease?: no ?     Date of exposure (if applicable): N/A ?     Name of person they were exposed to (if applicable): N/A ?Patient's Country of origin?: [place] ?O: Alert and oriented in NAD. ?P:  PPD placed on 11/27/2021.  Patient advised to return for reading within 48-72 hours.  ?

## 2021-11-27 NOTE — Telephone Encounter (Signed)
Called and spoke to patient. Appt scheduled for PPD test and PPD reading. Sw, cma  ?

## 2021-11-27 NOTE — Telephone Encounter (Signed)
Lvm for pt to call back and schedule.  

## 2021-11-29 ENCOUNTER — Telehealth: Payer: Self-pay | Admitting: Nurse Practitioner

## 2021-11-29 ENCOUNTER — Ambulatory Visit (INDEPENDENT_AMBULATORY_CARE_PROVIDER_SITE_OTHER): Payer: Medicaid Other

## 2021-11-29 ENCOUNTER — Other Ambulatory Visit: Payer: Self-pay

## 2021-11-29 DIAGNOSIS — Z111 Encounter for screening for respiratory tuberculosis: Secondary | ICD-10-CM

## 2021-11-29 LAB — TB SKIN TEST: TB Skin Test: NEGATIVE

## 2021-11-29 NOTE — Telephone Encounter (Signed)
Formed edited and placed upfront ?

## 2021-11-29 NOTE — Telephone Encounter (Signed)
Pt called and added that her employer needs a statement of proof that she refused the Hepatitis B shot. She called it an exzemption reading. ?

## 2021-11-29 NOTE — Progress Notes (Addendum)
PPD Reading: ?Result: < 5 mm induration. ?Interpretation: Negative ?Allergic reaction: no  ?Reading done By [place] CMA/CPT  ?

## 2021-12-30 ENCOUNTER — Ambulatory Visit (HOSPITAL_COMMUNITY)
Admission: EM | Admit: 2021-12-30 | Discharge: 2021-12-30 | Disposition: A | Discharge status: Home / Self Care | Payer: Medicaid Other | Attending: Student | Admitting: Student

## 2021-12-30 ENCOUNTER — Encounter (HOSPITAL_COMMUNITY): Payer: Self-pay | Admitting: Emergency Medicine

## 2021-12-30 ENCOUNTER — Ambulatory Visit (INDEPENDENT_AMBULATORY_CARE_PROVIDER_SITE_OTHER): Payer: Medicaid Other

## 2021-12-30 ENCOUNTER — Other Ambulatory Visit: Payer: Self-pay

## 2021-12-30 DIAGNOSIS — R0789 Other chest pain: Secondary | ICD-10-CM

## 2021-12-30 DIAGNOSIS — J301 Allergic rhinitis due to pollen: Secondary | ICD-10-CM

## 2021-12-30 DIAGNOSIS — R0782 Intercostal pain: Secondary | ICD-10-CM

## 2021-12-30 DIAGNOSIS — R059 Cough, unspecified: Secondary | ICD-10-CM | POA: Diagnosis not present

## 2021-12-30 MED ORDER — FLUTICASONE PROPIONATE 50 MCG/ACT NA SUSP: 2.0000 | Freq: Every day | NASAL | 2 refills | Status: AC

## 2021-12-30 MED ORDER — CETIRIZINE HCL 10 MG PO TABS: 10.0000 mg | ORAL_TABLET | Freq: Every day | ORAL | 2 refills | Status: AC

## 2021-12-30 NOTE — ED Provider Notes (Signed)
?[facility] ? ? ? ?CSN: [id number] ?Arrival date & time: 12/30/21  1638 ? ? ?  ? ?History   ?Chief Complaint ?Chief Complaint  ?Patient presents with  ? bilateral rib cage pain   ? ? ?HPI ?Donna Singh is a 26 y.o. female presenting with clear nasal congestion, cough for 3 weeks.  History of tonsil stones.  States she was initially sick with cough and congestion, this resolved though the pain in her bilateral anterior inferior rib cage persists.  States she is having clear nasal drainage and postnasal drip, and sometimes she hacks up some phlegm.  States her cough is nonproductive, and she is currently coughing infrequently.  Denies fever/chills, shortness of breath, chest pain, dizziness, weakness.  Has not attempted any medications for the symptoms.  States she does not get allergies. ? ?HPI ? ?Past Medical History:  ?Diagnosis Date  ? Anemia   ? ? ?Patient Active Problem List  ? Diagnosis Date Noted  ? Iron deficiency anemia 10/28/2021  ? Hemorrhoids 10/28/2021  ? Constipation 10/28/2021  ? Thyroid nodule 12/09/2019  ? Tonsillar calculus 12/09/2019  ? Sickle cell trait (HCC) 07/29/2017  ? Status post primary low transverse cesarean section 07/29/2017  ? ? ?Past Surgical History:  ?Procedure Laterality Date  ? CESAREAN SECTION N/A 07/29/2017  ? Procedure: CESAREAN SECTION;  Surgeon: Kathrynn Running, MD;  Location: [facility] BIRTHING SUITES;  Service: Obstetrics;  Laterality: N/A;  ? TONSILLECTOMY    ? ? ?OB History   ? ? Gravida  ?1  ? Para  ?   ? Term  ?   ? Preterm  ?   ? AB  ?   ? Living  ?   ?  ? ? SAB  ?   ? IAB  ?   ? Ectopic  ?   ? Multiple  ?   ? Live Births  ?0  ?   ?  ?  ? ? ? ?Home Medications   ? ?Prior to Admission medications   ?Medication Sig Start Date End Date Taking? Authorizing Provider  ?cetirizine (ZYRTEC ALLERGY) 10 MG tablet Take 1 tablet (10 mg total) by mouth daily. 12/30/21  Yes Rhys Martini, PA-C  ?fluticasone (FLONASE) 50 MCG/ACT nasal spray Place 2 sprays into both nostrils  daily. 12/30/21  Yes Rhys Martini, PA-C  ?norgestimate-ethinyl estradiol (ORTHO-CYCLEN) 0.25-35 MG-MCG tablet Take 1 tablet by mouth daily. ?Patient not taking: Reported on 11/19/2020 07/23/20 02/08/21  Levie Heritage, DO  ? ? ?Family History ?History reviewed. No pertinent family history. ? ?Social History ?Social History  ? ?Tobacco Use  ? Smoking status: Never  ? Smokeless tobacco: Never  ?Vaping Use  ? Vaping Use: Never used  ?Substance Use Topics  ? Alcohol use: No  ? Drug use: No  ? ? ? ?Allergies   ?Flulaval quadrivalent [influenza vac split quad] ? ? ?Review of Systems ?Review of Systems  ?HENT:  Positive for congestion.   ? ? ?Physical Exam ?Triage Vital Signs ?ED Triage Vitals  ?Enc Vitals Group  ?   BP 12/30/21 1732 100/61  ?   Pulse Rate 12/30/21 1732 (!) 54  ?   Resp 12/30/21 1732 18  ?   Temp 12/30/21 1732 98.1 ?F (36.7 ?C)  ?   Temp Source 12/30/21 1732 Oral  ?   SpO2 12/30/21 1732 100 %  ?   Weight --   ?   Height --   ?   Head Circumference --   ?  Peak Flow --   ?   Pain Score 12/30/21 1729 9  ?   Pain Loc --   ?   Pain Edu? --   ?   Excl. in GC? --   ? ?No data found. ? ?Updated Vital Signs ?BP 100/61 (BP Location: Left Arm)   Pulse (!) 54   Temp 98.1 ?F (36.7 ?C) (Oral)   Resp 18   LMP 12/20/2021 (Exact Date)   SpO2 100%  ? ?Visual Acuity ?Right Eye Distance:   ?Left Eye Distance:   ?Bilateral Distance:   ? ?Right Eye Near:   ?Left Eye Near:    ?Bilateral Near:    ? ?Physical Exam ?Vitals reviewed.  ?Constitutional:   ?   General: She is not in acute distress. ?   Appearance: Normal appearance. She is not ill-appearing.  ?HENT:  ?   Head: Normocephalic and atraumatic.  ?   Right Ear: Tympanic membrane, ear canal and external ear normal. No tenderness. No middle ear effusion. There is no impacted cerumen. Tympanic membrane is not perforated, erythematous, retracted or bulging.  ?   Left Ear: Tympanic membrane, ear canal and external ear normal. No tenderness.  No middle ear effusion. There  is no impacted cerumen. Tympanic membrane is not perforated, erythematous, retracted or bulging.  ?   Nose: Nose normal. No congestion.  ?   Mouth/Throat:  ?   Mouth: Mucous membranes are moist.  ?   Pharynx: Uvula midline. No oropharyngeal exudate or posterior oropharyngeal erythema.  ?Eyes:  ?   Extraocular Movements: Extraocular movements intact.  ?   Pupils: Pupils are equal, round, and reactive to light.  ?Cardiovascular:  ?   Rate and Rhythm: Normal rate and regular rhythm.  ?   Heart sounds: Normal heart sounds.  ?Pulmonary:  ?   Effort: Pulmonary effort is normal.  ?   Breath sounds: Normal breath sounds. No decreased breath sounds, wheezing, rhonchi or rales.  ?Chest:  ?   Chest wall: Tenderness present.  ?   Comments: TTP anterior inferior ribs without bony abnormality. ?Abdominal:  ?   Palpations: Abdomen is soft.  ?   Tenderness: There is no abdominal tenderness. There is no guarding or rebound.  ?Lymphadenopathy:  ?   Cervical: No cervical adenopathy.  ?   Right cervical: No superficial cervical adenopathy. ?   Left cervical: No superficial cervical adenopathy.  ?Neurological:  ?   General: No focal deficit present.  ?   Mental Status: She is alert and oriented to person, place, and time.  ?Psychiatric:     ?   Mood and Affect: Mood normal.     ?   Behavior: Behavior normal.     ?   Thought Content: Thought content normal.     ?   Judgment: Judgment normal.  ? ? ? ?UC Treatments / Results  ?Labs ?(all labs ordered are listed, but only abnormal results are displayed) ?Labs Reviewed - No data to display ? ?EKG ? ? ?Radiology ?DG Chest 2 View ? ?Result Date: 12/30/2021 ?CLINICAL DATA:  Cough and bilateral rib pain for 3 weeks, no reported injury EXAM: CHEST - 2 VIEW COMPARISON:  08/14/2018 chest radiograph. FINDINGS: Stable cardiomediastinal silhouette with normal heart size. No pneumothorax. No pleural effusion. Lungs appear clear, with no acute consolidative airspace disease and no pulmonary edema.  IMPRESSION: No active cardiopulmonary disease. Electronically Signed   By: Delbert PhenixJason A Poff M.D.   On: 12/30/2021 17:53   ? ?Procedures ?  Procedures (including critical care time) ? ?Medications Ordered in UC ?Medications - No data to display ? ?Initial Impression / Assessment and Plan / UC Course  ?I have reviewed the triage vital signs and the nursing notes. ? ?Pertinent labs & imaging results that were available during my care of the patient were reviewed by me and considered in my medical decision making (see chart for details). ? ?  ? ?This patient is a very pleasant 26 y.o. year old female presenting with chest wall pain and nasal congestion. Afebrile, nontachy. No adventitious breath sounds. CXR - no acute cardiopulmonary disease. Reassurance provided. Trial of zyrtec and flonase. ED return precautions discussed. Patient verbalizes understanding and agreement.  ?.  ? ?Final Clinical Impressions(s) / UC Diagnoses  ? ?Final diagnoses:  ?Seasonal allergic rhinitis due to pollen  ?Chest wall pain  ? ? ? ?Discharge Instructions   ? ?  ?-Zyrtec once daily for at least 7 days, continue for longer if it's helping ?-Flonase nasal steroid: place 2 sprays into both nostrils in the morning and at bedtime for at least 7 days. Continue for longer if this is helping. ?-Follow-up if symptoms worsen/change - shortness of breath, new fevers  ? ? ? ?ED Prescriptions   ? ? Medication Sig Dispense Auth. Provider  ? cetirizine (ZYRTEC ALLERGY) 10 MG tablet Take 1 tablet (10 mg total) by mouth daily. 30 tablet Rhys Martini, PA-C  ? fluticasone (FLONASE) 50 MCG/ACT nasal spray Place 2 sprays into both nostrils daily. 15 mL Rhys Martini, PA-C  ? ?  ? ?PDMP not reviewed this encounter. ?  ?Rhys Martini, PA-C ?12/30/21 1810 ? ?

## 2021-12-30 NOTE — Discharge Instructions (Addendum)
-  Zyrtec once daily for at least 7 days, continue for longer if it's helping ?-Flonase nasal steroid: place 2 sprays into both nostrils in the morning and at bedtime for at least 7 days. Continue for longer if this is helping. ?-Follow-up if symptoms worsen/change - shortness of breath, new fevers  ? ?

## 2021-12-30 NOTE — ED Triage Notes (Signed)
Bilateral rib cage pain for 3 weeks, reports phlegm with cough and chills.  Feels pain in ribs with breathing and coughing. Denies fever ?

## 2022-01-20 ENCOUNTER — Ambulatory Visit: Payer: Self-pay | Admitting: General Surgery

## 2022-01-20 NOTE — H&P (Signed)
?REFERRING PHYSICIAN:  Gerre Scull, NP ?  ?PROVIDER:  Janey Greaser, MD ?  ?MRN: [id number] ?DOB: 08/31/1996 ?DATE OF ENCOUNTER: 01/20/2022 ?Subjective  ?Chief Complaint: New Patient ?  ?  ?History of Present Illness: ?Donna Singh is a 26 y.o. female who is seen today as an office consultation for evaluation of New Patient ? Rodman Pickle asked me to see her in consultation regarding hemorrhoids.  She describes pain with bowel movements and significant itching.  She has had bleeding in the past but not recently.  She has constipation and was evaluated by Dr. Marina Goodell from GI as well.  He gave her some topical medication which she said did not help much. ?  ?  ?   ?Review of Systems: ?A complete review of systems was obtained from the patient.  I have reviewed this information and discussed as appropriate with the patient.  See HPI as well for other ROS. ?  ?ROS  ?  ?Medical History: ?Past Medical History  ?    ?Past Medical History:  ?Diagnosis Date  ? Anemia    ?  ?  ?  ?There is no problem list on file for this patient. ?  ?  ?Past Surgical History  ?     ?Past Surgical History:  ?Procedure Laterality Date  ? CESAREAN SECTION   2018  ? THYROIDECTOMY FOR REMOVAL REMAINING TISSUE   2022  ?  ?  ?  ?Allergies  ?No Known Allergies  ? ?  ?No current outpatient medications on file prior to visit.  ?  ?No current facility-administered medications on file prior to visit.  ?  ?  ?Family History  ?History reviewed. No pertinent family history.  ?  ?  ?Social History  ?  ?   ?Tobacco Use  ?Smoking Status Never  ?Smokeless Tobacco Never  ?  ?  ?Social History  ?Social History  ?  ?    ?Socioeconomic History  ? Marital status: Single  ?Tobacco Use  ? Smoking status: Never  ? Smokeless tobacco: Never  ?Substance and Sexual Activity  ? Alcohol use: Never  ? Drug use: Never  ?  ?  ?  ?Objective:  ?  ?   ?Vitals:  ?  01/20/22 1129  ?BP: 118/80  ?Pulse: 75  ?Temp: 36.6 ?C (97.9 ?F)  ?SpO2: 97%  ?Weight: 81.3 kg (179 lb  3.2 oz)  ?Height: 160 cm (5\' 3" )  ?  ?Body mass index is 31.74 kg/m?. ?  ?Physical Exam  ?  ?General appearance - alert, well appearing, and in no distress ?Chest - clear to auscultation, no wheezes, rales or rhonchi, symmetric air entry ?Heart - normal rate, regular rhythm, normal S1, S2, no murmurs, rubs, clicks or gallops ?Abdomen - soft, nontender, nondistended, no masses or organomegaly ?Rectal - External anal exam reveals an external hemorrhoid without current thrombosis or inflammation.  Digital rectal exam was unremarkable.  Anoscopy demonstrated 2 very small internal hemorrhoids but no significant inflammation or enlargement.  No evidence of bleeding. ?Neurological - alert, oriented, normal speech, no focal findings or movement disorder noted ?Extremities - peripheral pulses normal, no pedal edema, no clubbing or cyanosis ?  ?Labs, Imaging and Diagnostic Testing: ?/ ?  ?Assessment and Plan:  ?   ?Diagnoses and all orders for this visit: ?  ?External hemorrhoid ?  ?  ?  ?She does not have any significant internal hemorrhoids.  I discussed the options for management of her external hemorrhoid  being surgery with external hemorrhoidectomy versus medications.  She would like to proceed with surgery.  I discussed the procedure, risks, and benefits.  She looks forward to scheduling electively. ?  ?No follow-ups on file. ?Janey Greaser, MD  ?  ?

## 2022-01-24 NOTE — Progress Notes (Signed)
? ?BP 104/84 (BP Location: Left Arm, Patient Position: Sitting, Cuff Size: Normal)   Pulse (!) 57   Temp (!) 97.4 ?F (36.3 ?C) (Temporal)   Wt 180 lb 6.4 oz (81.8 kg)   SpO2 98%   BMI 30.97 kg/m?   ? ?Subjective:  ? ? Patient ID: Donna Singh, female    DOB: February 20, 1996, 26 y.o.   MRN: [id number] ? ?CC: ?Chief Complaint  ?Patient presents with  ? Annual Exam  ?  CPE.   ? ?HPI: ?Donna Singh is a 26 y.o. female presenting on 01/27/2022 for comprehensive medical examination. Current medical complaints include:none ? ?She currently lives with: partner and daughter ?Menopausal Symptoms: no ? ?Depression Screen done today and results listed below:  ? ?  01/27/2022  ?  2:05 PM 10/28/2021  ?  2:53 PM 11/25/2019  ? 10:33 AM  ?Depression screen PHQ 2/9  ?Decreased Interest 0 0 0  ?Down, Depressed, Hopeless 0 0 0  ?PHQ - 2 Score 0 0 0  ?Altered sleeping  0   ?Tired, decreased energy 2 3   ?Change in appetite 0 0   ?Feeling bad or failure about yourself  0 0   ?Trouble concentrating 0 0   ?Moving slowly or fidgety/restless 0 0   ?Suicidal thoughts 0 0   ?PHQ-9 Score  3   ?Difficult doing work/chores  Somewhat difficult   ? ? ?The patient does not have a history of falls. I did not complete a risk assessment for falls. A plan of care for falls was not documented. ? ? ?Past Medical History:  ?Past Medical History:  ?Diagnosis Date  ? Anemia   ? ? ?Surgical History:  ?Past Surgical History:  ?Procedure Laterality Date  ? CESAREAN SECTION N/A 07/29/2017  ? Procedure: CESAREAN SECTION;  Surgeon: Kathrynn Running, MD;  Location: [facility] BIRTHING SUIT[facility];  Service: Obstetrics;  Laterality: N/A;  ? TONSILLECTOMY    ? ? ?Medications:  ?Current Outpatient Medications on File Prior to Visit  ?Medication Sig  ? cetirizine (ZYRTEC ALLERGY) 10 MG tablet Take 1 tablet (10 mg total) by mouth daily.  ? fluticasone (FLONASE) 50 MCG/ACT nasal spray Place 2 sprays into both nostrils daily.  ? [DISCONTINUED] norgestimate-ethinyl estradiol  (ORTHO-CYCLEN) 0.25-35 MG-MCG tablet Take 1 tablet by mouth daily. (Patient not taking: Reported on 11/19/2020)  ? ?No current facility-administered medications on file prior to visit.  ? ? ?Allergies:  ?Allergies  ?Allergen Reactions  ? Flulaval Quadrivalent [Influenza Vac Split Quad] Nausea And Vomiting  ? ? ?Social History:  ?Social History  ? ?Socioeconomic History  ? Marital status: Significant Other  ?  Spouse name: Not on file  ? Number of children: 2  ? Years of education: Not on file  ? Highest education level: Associate degree: academic program  ?Occupational History  ? Occupation: Agricultural engineer  ?Tobacco Use  ? Smoking status: Never  ? Smokeless tobacco: Never  ?Vaping Use  ? Vaping Use: Never used  ?Substance and Sexual Activity  ? Alcohol use: No  ? Drug use: No  ? Sexual activity: Yes  ?Other Topics Concern  ? Not on file  ?Social History Narrative  ? 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 She is a Lawyer.   ? Lives with 2 kids  ? Right handed  ? Caffeine: 1 cup of coffee a week   ? ?Social Determinants of Health  ? ?Financial Resource Strain: Not on file  ?Food Insecurity: Not on file  ?Transportation Needs: Not on  file  ?Physical Activity: Not on file  ?Stress: Not on file  ?Social Connections: Not on file  ?Intimate Partner Violence: Not on file  ? ?Social History  ? ?Tobacco Use  ?Smoking Status Never  ?Smokeless Tobacco Never  ? ?Social History  ? ?Substance and Sexual Activity  ?Alcohol Use No  ? ? ?Family History:  ?History reviewed. No pertinent family history. ? ?Past medical history, surgical history, medications, allergies, family history and social history reviewed with patient today and changes made to appropriate areas of the chart.  ? ?Review of Systems  ?Constitutional:  Positive for malaise/fatigue. Negative for fever.  ?HENT: Negative.    ?Eyes: Negative.   ?Respiratory: Negative.    ?Cardiovascular: Negative.   ?Gastrointestinal: Negative.   ?Genitourinary: Negative.   ?Musculoskeletal: Negative.    ?Skin: Negative.   ?Neurological: Negative.   ?Endo/Heme/Allergies:  Positive for environmental allergies.  ?Psychiatric/Behavioral: Negative.    ? ?   ?Objective:  ?  ?BP 104/84 (BP Location: Left Arm, Patient Position: Sitting, Cuff Size: Normal)   Pulse (!) 57   Temp (!) 97.4 ?F (36.3 ?C) (Temporal)   Wt 180 lb 6.4 oz (81.8 kg)   SpO2 98%   BMI 30.97 kg/m?   ?Wt Readings from Last 3 Encounters:  ?01/27/22 180 lb 6.4 oz (81.8 kg)  ?11/14/21 184 lb (83.5 kg)  ?10/28/21 180 lb 6.4 oz (81.8 kg)  ?  ?Physical Exam ?Vitals and nursing note reviewed. Exam conducted with a chaperone present.  ?Constitutional:   ?   General: She is not in acute distress. ?   Appearance: Normal appearance.  ?HENT:  ?   Head: Normocephalic and atraumatic.  ?   Right Ear: Tympanic membrane, ear canal and external ear normal.  ?   Left Ear: Tympanic membrane, ear canal and external ear normal.  ?   Nose: Nose normal.  ?   Mouth/Throat:  ?   Mouth: Mucous membranes are moist.  ?   Pharynx: Oropharynx is clear.  ?Eyes:  ?   Conjunctiva/sclera: Conjunctivae normal.  ?Cardiovascular:  ?   Rate and Rhythm: Normal rate and regular rhythm.  ?   Pulses: Normal pulses.  ?   Heart sounds: Normal heart sounds.  ?Pulmonary:  ?   Effort: Pulmonary effort is normal.  ?   Breath sounds: Normal breath sounds.  ?Abdominal:  ?   General: Bowel sounds are normal.  ?   Palpations: Abdomen is soft.  ?   Tenderness: There is no abdominal tenderness.  ?Genitourinary: ?   General: Normal vulva.  ?   Exam position: Lithotomy position.  ?   Labia:     ?   Right: No rash, tenderness or lesion.     ?   Left: No rash, tenderness or lesion.   ?   Vagina: Normal.  ?   Cervix: Normal.  ?   Uterus: Normal.   ?   Adnexa: Right adnexa normal and left adnexa normal.  ?Musculoskeletal:     ?   General: Normal range of motion.  ?   Cervical back: Normal range of motion and neck supple. No tenderness.  ?   Right lower leg: No edema.  ?   Left lower leg: No edema.   ?Lymphadenopathy:  ?   Cervical: No cervical adenopathy.  ?Skin: ?   General: Skin is warm and dry.  ?Neurological:  ?   General: No focal deficit present.  ?   Mental Status: She is alert  and oriented to person, place, and time.  ?   Cranial Nerves: No cranial nerve deficit.  ?   Coordination: Coordination normal.  ?   Gait: Gait normal.  ?Psychiatric:     ?   Mood and Affect: Mood normal.     ?   Behavior: Behavior normal.     ?   Thought Content: Thought content normal.     ?   Judgment: Judgment normal.  ? ? ?Results for orders placed or performed in visit on 11/27/21  ?PPD  ?Result Value Ref Range  ? TB Skin Test Negative   ? Induration <64mm mm  ? ?   ?Assessment & Plan:  ? ?Problem List Items Addressed This Visit   ? ?  ? Other  ? Low grade squamous intraepithelial lesion on cytologic smear of cervix (LGSIL)  ?  Last pap showed LSIL with negative HPV. Per ASCCP guidelines, recommend repeat pap 1 year. Pap done today.  ? ?  ?  ? Relevant Orders  ? Cytology - PAP  ? ?Other Visit Diagnoses   ? ? Routine general medical examination at a health care facility    -  Primary  ? Health maintenance reviewed and updated. She declines Td vaccine today. Last CMP, CBC reviewed. Follow-up in 1 year  ? ?  ?  ? ?Follow up plan: ?Return in about 1 year (around 01/28/2023) for CPE. ? ? ?LABORATORY TESTING:  ?- Pap smear: up to date ? ?IMMUNIZATIONS:   ?- Tdap: Tetanus vaccination status reviewed: declined. ?- Influenza: Postponed to flu season ?- Pneumovax: Not applicable ?- Prevnar: Not applicable ?- HPV: Refused ?- Zostavax vaccine: Not applicable ? ?SCREENING: ?-Mammogram: Not applicable  ?- Colonoscopy: Not applicable  ?- Bone Density: Not applicable  ?-Hearing Test: Not applicable  ?-Spirometry: Not applicable  ? ?PATIENT COUNSELING:   ?Advised to take 1 mg of folate supplement per day if capable of pregnancy.  ? ?Sexuality: Discussed sexually transmitted diseases, partner selection, use of condoms, avoidance of unintended  pregnancy  and contraceptive alternatives.  ? ?Advised to avoid cigarette smoking. ? ?I discussed with the patient that most people either abstain from alcohol or drink within safe limits (<=14/week and <=4 drinks/occasion for males, <

## 2022-01-27 ENCOUNTER — Encounter: Payer: Self-pay | Admitting: Nurse Practitioner

## 2022-01-27 ENCOUNTER — Ambulatory Visit (INDEPENDENT_AMBULATORY_CARE_PROVIDER_SITE_OTHER): Payer: Medicaid Other | Admitting: Nurse Practitioner

## 2022-01-27 ENCOUNTER — Other Ambulatory Visit (HOSPITAL_COMMUNITY)
Admission: RE | Admit: 2022-01-27 | Discharge: 2022-01-27 | Disposition: A | Discharge status: Home / Self Care | Payer: Medicaid Other | Source: Ambulatory Visit | Attending: Nurse Practitioner | Admitting: Nurse Practitioner

## 2022-01-27 VITALS — BP 104/84 | HR 57 | Temp 97.4°F | Wt 180.4 lb

## 2022-01-27 DIAGNOSIS — R87612 Low grade squamous intraepithelial lesion on cytologic smear of cervix (LGSIL): Secondary | ICD-10-CM

## 2022-01-27 DIAGNOSIS — Z0000 Encounter for general adult medical examination without abnormal findings: Secondary | ICD-10-CM

## 2022-01-27 NOTE — Patient Instructions (Signed)
It was great to see you! ? ?We will let you know the results of your pap. ? ?Have fun at your wedding and good luck with your surgery and move. Please reach out if you have any questions! ? ?Let's follow-up in 1 year, sooner if you have concerns. ? ?If a referral was placed today, you will be contacted for an appointment. Please note that routine referrals can sometimes take up to 3-4 weeks to process. Please call our office if you haven't heard anything after this time frame. ? ?Take care, ? ?Rodman Pickle, NP ? ?

## 2022-01-27 NOTE — Assessment & Plan Note (Signed)
Last pap showed LSIL with negative HPV. Per ASCCP guidelines, recommend repeat pap 1 year. Pap done today.  ?

## 2022-01-29 ENCOUNTER — Encounter: Payer: Self-pay | Admitting: Nurse Practitioner

## 2022-01-29 LAB — CYTOLOGY - PAP: Adequacy: ABSENT

## 2022-02-19 ENCOUNTER — Inpatient Hospital Stay (HOSPITAL_COMMUNITY)
Admission: AD | Admit: 2022-02-19 | Discharge: 2022-02-19 | Disposition: A | Discharge status: Home / Self Care | Payer: Medicaid Other | Attending: Obstetrics and Gynecology | Admitting: Obstetrics and Gynecology

## 2022-02-19 ENCOUNTER — Other Ambulatory Visit: Payer: Self-pay

## 2022-02-19 ENCOUNTER — Encounter (HOSPITAL_COMMUNITY): Payer: Self-pay | Admitting: Obstetrics and Gynecology

## 2022-02-19 DIAGNOSIS — U071 COVID-19: Secondary | ICD-10-CM | POA: Insufficient documentation

## 2022-02-19 DIAGNOSIS — M545 Low back pain, unspecified: Secondary | ICD-10-CM | POA: Diagnosis not present

## 2022-02-19 DIAGNOSIS — O218 Other vomiting complicating pregnancy: Secondary | ICD-10-CM | POA: Insufficient documentation

## 2022-02-19 DIAGNOSIS — Z3A01 Less than 8 weeks gestation of pregnancy: Secondary | ICD-10-CM | POA: Diagnosis not present

## 2022-02-19 DIAGNOSIS — O26891 Other specified pregnancy related conditions, first trimester: Secondary | ICD-10-CM | POA: Diagnosis not present

## 2022-02-19 DIAGNOSIS — O98511 Other viral diseases complicating pregnancy, first trimester: Secondary | ICD-10-CM | POA: Diagnosis not present

## 2022-02-19 DIAGNOSIS — R059 Cough, unspecified: Secondary | ICD-10-CM | POA: Diagnosis not present

## 2022-02-19 DIAGNOSIS — O210 Mild hyperemesis gravidarum: Secondary | ICD-10-CM | POA: Diagnosis not present

## 2022-02-19 LAB — URINALYSIS, ROUTINE W REFLEX MICROSCOPIC
Bilirubin Urine: NEGATIVE
Glucose, UA: NEGATIVE mg/dL
Hgb urine dipstick: NEGATIVE
Ketones, ur: NEGATIVE mg/dL
Leukocytes,Ua: NEGATIVE
Nitrite: NEGATIVE
Protein, ur: NEGATIVE mg/dL
Specific Gravity, Urine: 1.015 (ref 1.005–1.030)
pH: 5 (ref 5.0–8.0)

## 2022-02-19 LAB — CBC
HCT: 34.9 % — ABNORMAL LOW (ref 36.0–46.0)
Hemoglobin: 11.3 g/dL — ABNORMAL LOW (ref 12.0–15.0)
MCH: 24.8 pg — ABNORMAL LOW (ref 26.0–34.0)
MCHC: 32.4 g/dL (ref 30.0–36.0)
MCV: 76.5 fL — ABNORMAL LOW (ref 80.0–100.0)
Platelets: 337 10*3/uL (ref 150–400)
RBC: 4.56 MIL/uL (ref 3.87–5.11)
RDW: 13.3 % (ref 11.5–15.5)
WBC: 5.8 10*3/uL (ref 4.0–10.5)
nRBC: 0 % (ref 0.0–0.2)

## 2022-02-19 LAB — BASIC METABOLIC PANEL
Anion gap: 6 (ref 5–15)
BUN: 9 mg/dL (ref 6–20)
CO2: 26 mmol/L (ref 22–32)
Calcium: 9.9 mg/dL (ref 8.9–10.3)
Chloride: 107 mmol/L (ref 98–111)
Creatinine, Ser: 0.72 mg/dL (ref 0.44–1.00)
GFR, Estimated: >60 mL/min (ref >60)
Glucose, Bld: 90 mg/dL (ref 70–99)
Potassium: 3.9 mmol/L (ref 3.5–5.1)
Sodium: 139 mmol/L (ref 135–145)

## 2022-02-19 LAB — RESP PANEL BY RT-PCR (FLU A&B, COVID) ARPGX2
Influenza A by PCR: NEGATIVE
Influenza B by PCR: NEGATIVE
SARS Coronavirus 2 by RT PCR: POSITIVE — AB

## 2022-02-19 MED ORDER — ACETAMINOPHEN 500 MG PO TABS
1000.0000 mg | ORAL_TABLET | Freq: Four times a day (QID) | ORAL | Status: DC | PRN
  Administered 2022-02-19: 1000 mg via ORAL
  Filled 2022-02-19: qty 2

## 2022-02-19 MED ORDER — PROMETHAZINE HCL 25 MG PO TABS: 25.0000 mg | ORAL_TABLET | Freq: Four times a day (QID) | ORAL | 0 refills | Status: AC | PRN

## 2022-02-19 MED ORDER — PROMETHAZINE HCL 25 MG PO TABS: 25.0000 mg | ORAL_TABLET | Freq: Four times a day (QID) | ORAL | Status: DC | PRN

## 2022-02-19 NOTE — Discharge Instructions (Signed)

## 2022-02-19 NOTE — MAU Provider Note (Addendum)
History     CSN: [id number]  Arrival date and time: 02/19/22 1528   Event Date/Time   First Provider Initiated Contact with Patient 02/19/22 1713      Chief Complaint  Patient presents with   Back Pain   Nausea   Possible Pregnancy   26 y.o. G3P1011 @[redacted]w[redacted]d  by sure LMP presenting with low back pain, HA and dizziness. Reports sx onset last week. Back pain is bilateral and low but worse on left. Endorses urinary frequency but denies other sx. Denies abdominal pain or VB. Endorses intermittent nausea and vomiting, none in a few days. Endorses non-productive cough. Denies other respiratory sx or sick contacts.  OB History     Gravida  3   Para  1   Term  1   Preterm      AB  1   Living  1      SAB      IAB  1   Ectopic      Multiple      Live Births  1        Obstetric Comments  Elective ab- pills         Past Medical History:  Diagnosis Date   Anemia     Past Surgical History:  Procedure Laterality Date   CESAREAN SECTION N/A 07/29/2017   Procedure: CESAREAN SECTION;  Surgeon: Gwynne Edinger, MD;  Location: [facility];  Service: Obstetrics;  Laterality: N/A;   TONSILLECTOMY      Family History  Problem Relation Age of Onset   Healthy Mother    Healthy Father     Social History   Tobacco Use   Smoking status: Never   Smokeless tobacco: Never  Vaping Use   Vaping Use: Never used  Substance Use Topics   Alcohol use: No   Drug use: No    Allergies:  Allergies  Allergen Reactions   Flulaval Quadrivalent [Influenza Vac Split Quad] Nausea And Vomiting    Medications Prior to Admission  Medication Sig Dispense Refill Last Dose   cetirizine (ZYRTEC ALLERGY) 10 MG tablet Take 1 tablet (10 mg total) by mouth daily. 30 tablet 2    fluticasone (FLONASE) 50 MCG/ACT nasal spray Place 2 sprays into both nostrils daily. 15 mL 2     Review of Systems  Constitutional:  Negative for chills and fever.  HENT:  Negative for sore throat.    Eyes:  Negative for visual disturbance.  Respiratory:  Positive for cough. Negative for shortness of breath.   Gastrointestinal:  Positive for nausea and vomiting. Negative for abdominal pain.  Genitourinary:  Positive for frequency. Negative for dysuria, hematuria and vaginal bleeding.  Neurological:  Positive for dizziness.  Physical Exam   Blood pressure 101/64, pulse 71, temperature 99 F (37.2 C), temperature source Oral, resp. rate 16, height 5\' 3"  (1.6 m), weight 82.6 kg, last menstrual period 01/16/2022, SpO2 100 %.  Physical Exam Vitals and nursing note reviewed.  Constitutional:      General: She is not in acute distress.    Appearance: Normal appearance.  HENT:     Head: Normocephalic and atraumatic.  Cardiovascular:     Rate and Rhythm: Normal rate and regular rhythm.     Heart sounds: Normal heart sounds.  Pulmonary:     Effort: Pulmonary effort is normal. No respiratory distress.     Breath sounds: Normal breath sounds. No stridor. No wheezing, rhonchi or rales.  Abdominal:     General:  There is no distension.     Palpations: Abdomen is soft. There is no mass.     Tenderness: There is no abdominal tenderness. There is no right CVA tenderness, left CVA tenderness, guarding or rebound.     Hernia: No hernia is present.  Musculoskeletal:        General: Normal range of motion.     Cervical back: Normal range of motion.  Skin:    General: Skin is warm and dry.  Neurological:     General: No focal deficit present.     Mental Status: She is alert and oriented to person, place, and time.  Psychiatric:        Mood and Affect: Mood normal.        Behavior: Behavior normal.   Results for orders placed or performed during the hospital encounter of 02/19/22 (from the past 24 hour(s))  Urinalysis, Routine w reflex microscopic Urine, Clean Catch     Status: Abnormal   Collection Time: 02/19/22  4:18 PM  Result Value Ref Range   Color, Urine YELLOW YELLOW   APPearance  HAZY (A) CLEAR   Specific Gravity, Urine 1.015 1.005 - 1.030   pH 5.0 5.0 - 8.0   Glucose, UA NEGATIVE NEGATIVE mg/dL   Hgb urine dipstick NEGATIVE NEGATIVE   Bilirubin Urine NEGATIVE NEGATIVE   Ketones, ur NEGATIVE NEGATIVE mg/dL   Protein, ur NEGATIVE NEGATIVE mg/dL   Nitrite NEGATIVE NEGATIVE   Leukocytes,Ua NEGATIVE NEGATIVE   RBC / HPF 0-5 0 - 5 RBC/hpf   WBC, UA 0-5 0 - 5 WBC/hpf   Bacteria, UA RARE (A) NONE SEEN   Squamous Epithelial / LPF 0-5 0 - 5   Mucus PRESENT   CBC     Status: Abnormal   Collection Time: 02/19/22  6:14 PM  Result Value Ref Range   WBC 5.8 4.0 - 10.5 K/uL   RBC 4.56 3.87 - 5.11 MIL/uL   Hemoglobin 11.3 (L) 12.0 - 15.0 g/dL   HCT 34.9 (L) 36.0 - 46.0 %   MCV 76.5 (L) 80.0 - 100.0 fL   MCH 24.8 (L) 26.0 - 34.0 pg   MCHC 32.4 30.0 - 36.0 g/dL   RDW 13.3 11.5 - 15.5 %   Platelets 337 150 - 400 K/uL   nRBC 0.0 0.0 - 0.2 %  Basic metabolic panel     Status: None   Collection Time: 02/19/22  6:14 PM  Result Value Ref Range   Sodium 139 135 - 145 mmol/L   Potassium 3.9 3.5 - 5.1 mmol/L   Chloride 107 98 - 111 mmol/L   CO2 26 22 - 32 mmol/L   Glucose, Bld 90 70 - 99 mg/dL   BUN 9 6 - 20 mg/dL   Creatinine, Ser 0.72 0.44 - 1.00 mg/dL   Calcium 9.9 8.9 - 10.3 mg/dL   GFR, Estimated >60 >60 mL/min   Anion gap 6 5 - 15   MAU Course  Procedures  MDM Labs ordered and reviewed. HA resolved spontaneously. Back pain improved with Tylenol. No signs of pyelo or UTI. Resp panel pending but low suspicion for virus. Pt is planning to move to [place] in 3 weeks, recommend she establish care by 10 weeks. She is stable for discharge home.   Assessment and Plan   1. [redacted] weeks gestation of pregnancy   2. Acute bilateral low back pain without sciatica   3. Morning sickness   4. Cough, unspecified type    Discharge home  Follow up with OB provider of choice to start care Start PNV Rx Phenergan prn  Allergies as of 02/19/2022       Reactions   Flulaval Quadrivalent  [influenza Vac Split Quad] Nausea And Vomiting        Medication List     TAKE these medications    cetirizine 10 MG tablet Commonly known as: ZyrTEC Allergy Take 1 tablet (10 mg total) by mouth daily.   fluticasone 50 MCG/ACT nasal spray Commonly known as: FLONASE Place 2 sprays into both nostrils daily.   promethazine 25 MG tablet Commonly known as: PHENERGAN Take 1 tablet (25 mg total) by mouth every 6 (six) hours as needed for nausea or vomiting.       Julianne Handler, CNM 02/19/2022, 8:13 PM

## 2022-02-19 NOTE — MAU Note (Signed)
Donna Singh is a 26 y.o.  here in MAU reporting: been feeling sick lately,vomiting, feeling dizzy, having headaches (has not taken any Tylenol), low back pains. Neg HPT 2 wks ago. Had spotting 1 day (5/27) LMP: 5/4 Onset of complaint: last wk Pain score: HA 8; back 7 Vitals:   02/19/22 1600  BP: 101/64  Pulse: 71  Resp: 16  Temp: 99 F (37.2 C)  SpO2: 100%      Lab orders placed from triage:  UPT/UA

## 2022-03-02 ENCOUNTER — Encounter (HOSPITAL_COMMUNITY): Payer: Self-pay | Admitting: Anesthesiology

## 2022-03-03 ENCOUNTER — Ambulatory Visit (HOSPITAL_COMMUNITY): Admission: RE | Admit: 2022-03-03 | Payer: Medicaid Other | Source: Ambulatory Visit | Admitting: General Surgery

## 2022-03-03 SURGERY — HEMORRHOIDECTOMY
Anesthesia: General

## 2022-03-03 MED ORDER — ONDANSETRON HCL 4 MG/2ML IJ SOLN
INTRAMUSCULAR | Status: AC
  Filled 2022-03-03: qty 2

## 2022-03-03 MED ORDER — MIDAZOLAM HCL 2 MG/2ML IJ SOLN
INTRAMUSCULAR | Status: AC
  Filled 2022-03-03: qty 2

## 2022-03-03 MED ORDER — LIDOCAINE 2% (20 MG/ML) 5 ML SYRINGE
INTRAMUSCULAR | Status: AC
  Filled 2022-03-03: qty 5

## 2022-03-03 MED ORDER — PROPOFOL 10 MG/ML IV BOLUS
INTRAVENOUS | Status: AC
  Filled 2022-03-03: qty 20

## 2022-03-03 MED ORDER — ROCURONIUM BROMIDE 10 MG/ML (PF) SYRINGE
PREFILLED_SYRINGE | INTRAVENOUS | Status: AC
  Filled 2022-03-03: qty 10

## 2022-03-03 MED ORDER — DEXAMETHASONE SODIUM PHOSPHATE 10 MG/ML IJ SOLN
INTRAMUSCULAR | Status: AC
  Filled 2022-03-03: qty 1

## 2022-03-03 MED ORDER — FENTANYL CITRATE (PF) 250 MCG/5ML IJ SOLN
INTRAMUSCULAR | Status: AC
  Filled 2022-03-03: qty 5

## 2022-03-03 NOTE — Progress Notes (Signed)
Patient remains on OR schedule despite stating that she was canceling due to being pregnant.  Attempted to reach patient this morning to confirm but no answer.  OR desk aware.  Dr. Janee Morn has been paged.

## 2023-05-10 IMAGING — CT CT ABD-PELV W/O CM
2 of 4 series · 16 of 46 positions shown, 18 images · non-contrast
Comparison: None.

CLINICAL DATA: Right lower quadrant pain.

EXAM:
CT ABDOMEN AND PELVIS WITHOUT CONTRAST
TECHNIQUE: Multidetector CT imaging of the abdomen and pelvis was performed
following the standard protocol without IV contrast.

[Series 3: ap without · axial · non-contrast · 0.81mm/px · z∈[+862,+1287]mm · 13 of 96 slices shown, 15 images]
[im 6/96  soft-tissue]
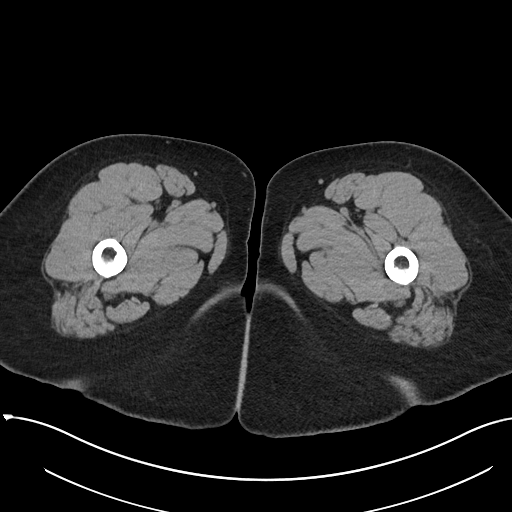
[im 6/96  bone]
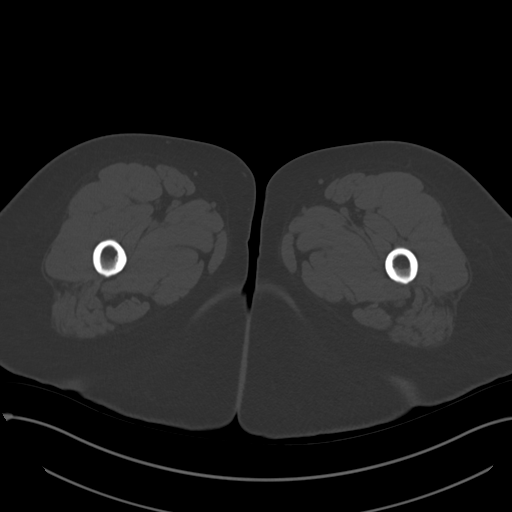
[im 16/96  soft-tissue]
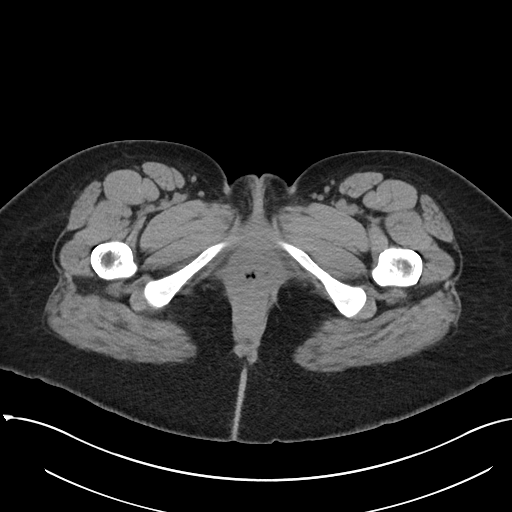
[im 21/96  soft-tissue]
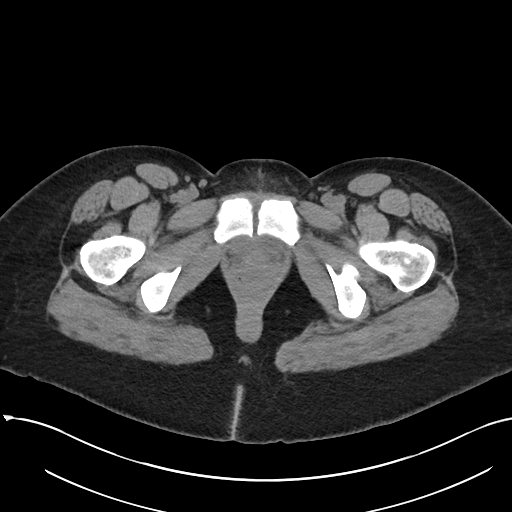
[im 26/96  soft-tissue]
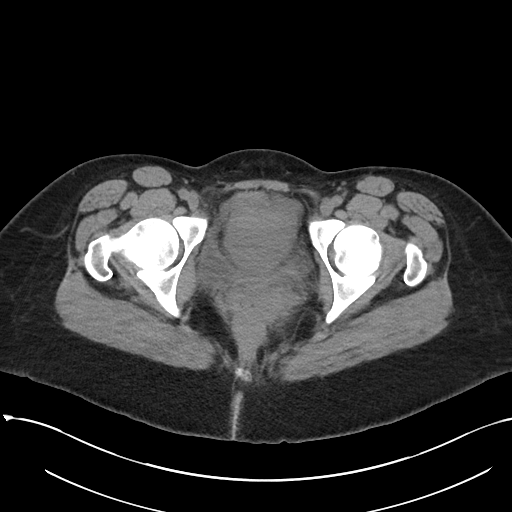
[im 36/96  soft-tissue]
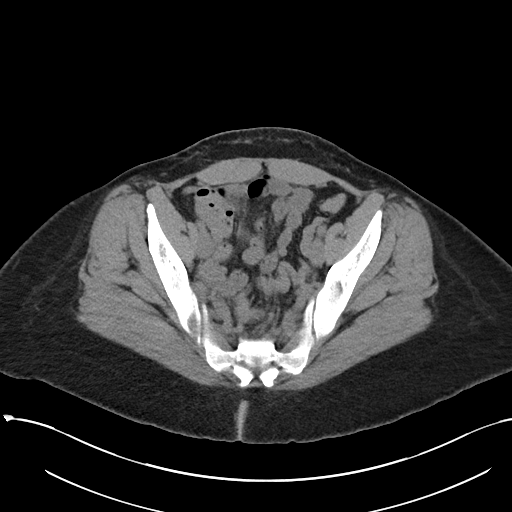
[im 41/96  soft-tissue]
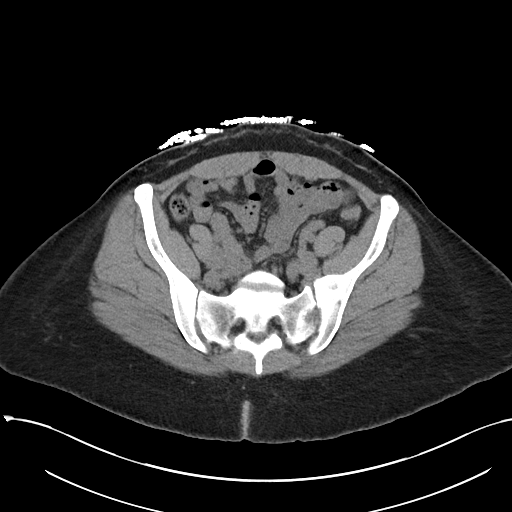
[im 51/96  soft-tissue]
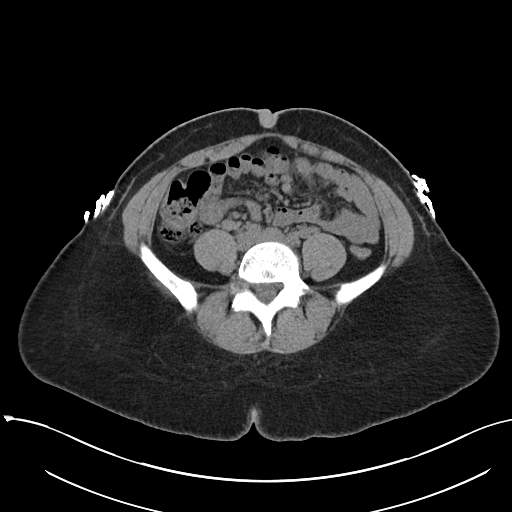
[im 56/96  soft-tissue]
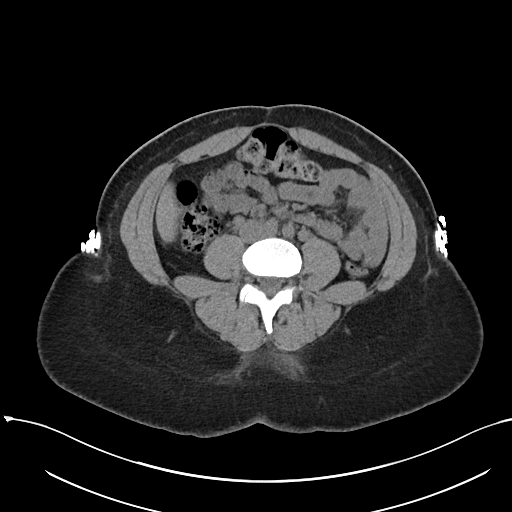
[im 61/96  soft-tissue]
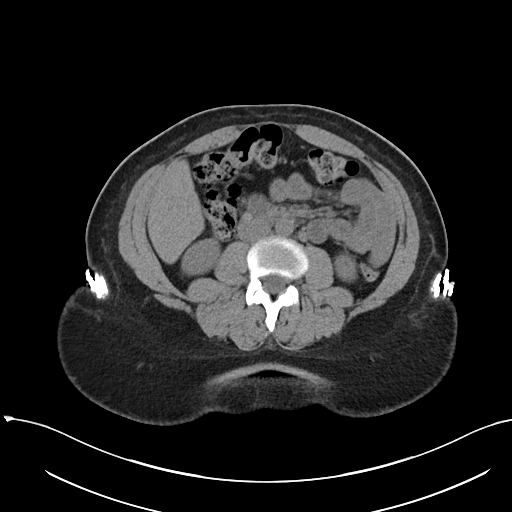
[im 61/96  bone]
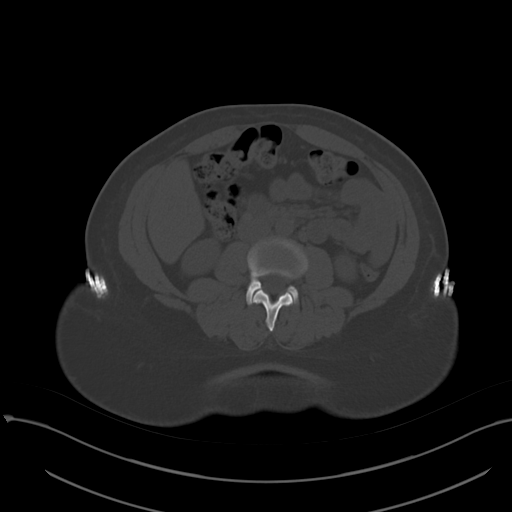
[im 71/96  soft-tissue]
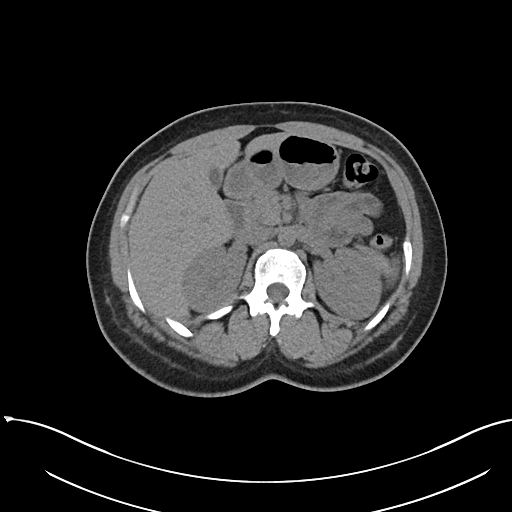
[im 76/96  soft-tissue]
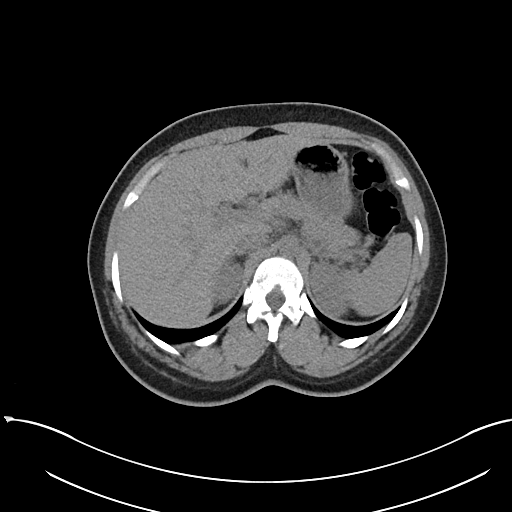
[im 81/96  soft-tissue]
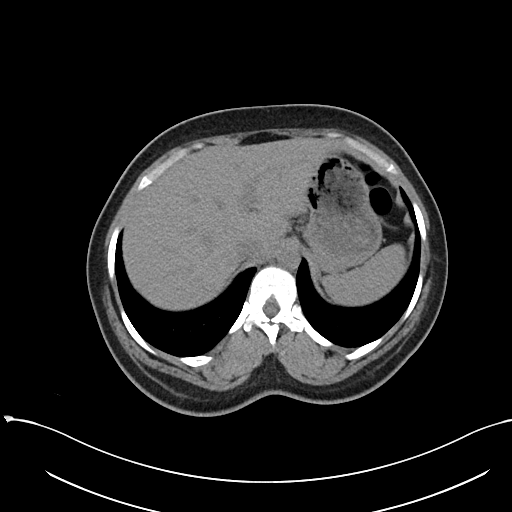
[im 91/96  soft-tissue]
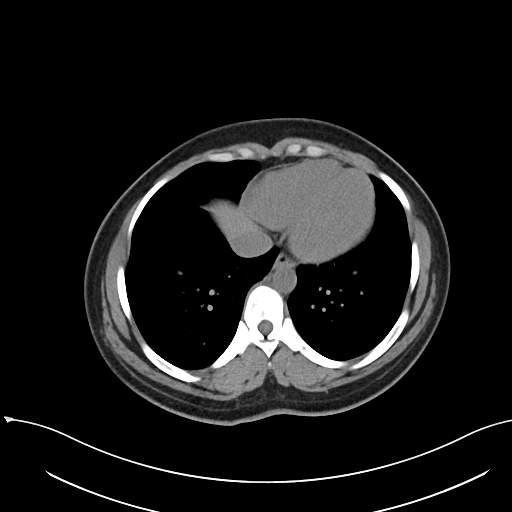

[Series 6: cor · coronal · 0.74mm/px · 3 of 96 slices shown]
[im 32/96  soft-tissue]
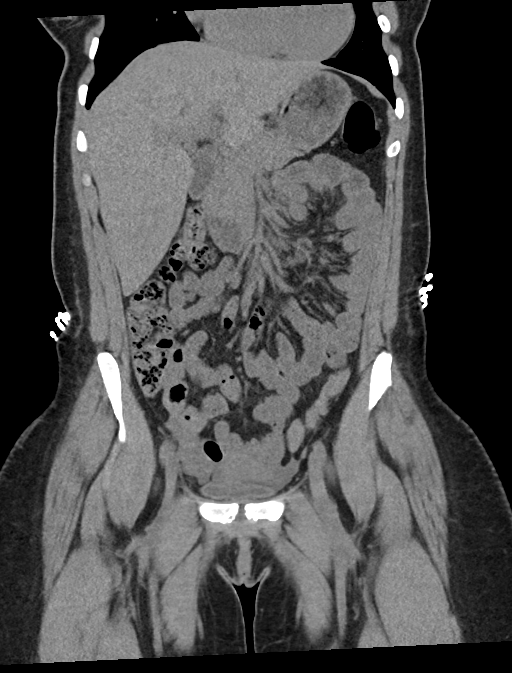
[im 43/96  soft-tissue]
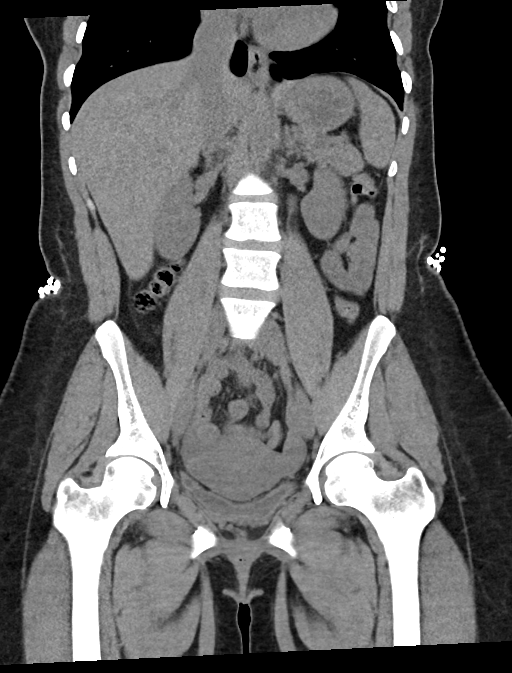
[im 53/96  soft-tissue]
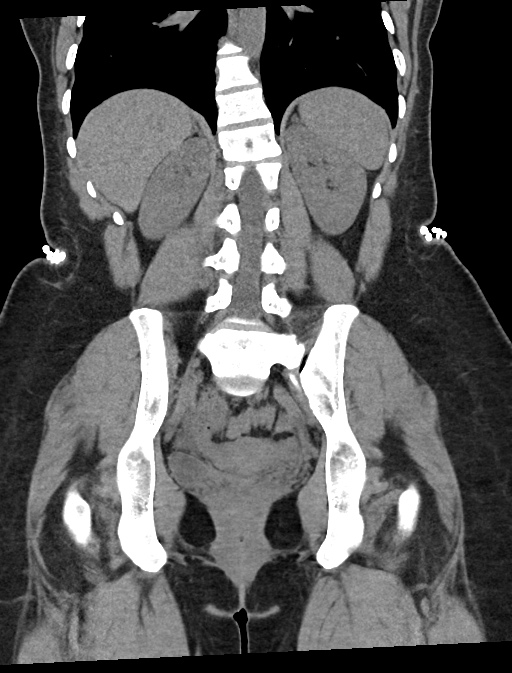

[16 of 46 positions shown; findings below may reference images not displayed]

FINDINGS: Lower chest: No acute abnormality.

Hepatobiliary: No focal liver abnormality. The gallbladder is
contracted. No gallstones, gallbladder wall thickening, or
pericholecystic fluid. No biliary dilatation.

Pancreas: No focal lesion. Normal pancreatic contour. No surrounding
inflammatory changes. No main pancreatic ductal dilatation.

Spleen: Normal in size without focal abnormality.

Adrenals/Urinary Tract:

No adrenal nodule bilaterally.

No nephrolithiasis, no hydronephrosis, and no contour-deforming
renal mass. No ureterolithiasis or hydroureter.

The urinary bladder is unremarkable.

Stomach/Bowel: Stomach is within normal limits. No evidence of bowel
wall thickening or dilatation. Appendix appears normal.

Vascular/Lymphatic: No significant vascular findings are present. No
enlarged abdominal or pelvic lymph nodes.

Reproductive: Uterus and bilateral adnexa are unremarkable.

Other: No intraperitoneal free fluid. No intraperitoneal free gas.
No organized fluid collection.

Musculoskeletal: Tiny fat containing umbilical hernia. No acute or
significant osseous findings.
IMPRESSION: 1. Normal appendix.
2. No acute intra-abdominal or intrapelvic abnormality on this
noncontrast study.
